# Patient Record
Sex: Female | Born: 1990 | Race: Black or African American | Hispanic: No | Marital: Married | Smoking: Never smoker
Health system: Southern US, Community
[De-identification: ages and names within clinical notes are randomized; demographics above are authoritative.]

## PROBLEM LIST (undated history)

## (undated) ENCOUNTER — Inpatient Hospital Stay (HOSPITAL_COMMUNITY): Payer: Self-pay

## (undated) DIAGNOSIS — F32A Depression, unspecified: Secondary | ICD-10-CM

## (undated) DIAGNOSIS — R635 Abnormal weight gain: Secondary | ICD-10-CM

## (undated) DIAGNOSIS — F419 Anxiety disorder, unspecified: Secondary | ICD-10-CM

## (undated) DIAGNOSIS — G43909 Migraine, unspecified, not intractable, without status migrainosus: Secondary | ICD-10-CM

## (undated) DIAGNOSIS — N926 Irregular menstruation, unspecified: Principal | ICD-10-CM

## (undated) HISTORY — DX: Migraine, unspecified, not intractable, without status migrainosus: G43.909

## (undated) HISTORY — PX: WISDOM TOOTH EXTRACTION: SHX21

## (undated) HISTORY — DX: Anxiety disorder, unspecified: F41.9

## (undated) HISTORY — DX: Depression, unspecified: F32.A

## (undated) HISTORY — DX: Irregular menstruation, unspecified: N92.6

## (undated) HISTORY — DX: Abnormal weight gain: R63.5

---
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
## 2009-01-27 ENCOUNTER — Encounter: Payer: Self-pay | Admitting: Orthopedic Surgery

## 2009-05-24 ENCOUNTER — Emergency Department (HOSPITAL_COMMUNITY): Admission: EM | Admit: 2009-05-24 | Discharge: 2009-05-24 | Payer: Self-pay | Admitting: Emergency Medicine

## 2010-01-20 ENCOUNTER — Emergency Department (HOSPITAL_COMMUNITY): Admission: EM | Admit: 2010-01-20 | Discharge: 2010-01-20 | Payer: Self-pay | Admitting: Emergency Medicine

## 2010-05-19 ENCOUNTER — Ambulatory Visit (HOSPITAL_COMMUNITY): Admission: AD | Admit: 2010-05-19 | Discharge: 2010-05-19 | Payer: Self-pay | Admitting: Obstetrics & Gynecology

## 2010-08-03 ENCOUNTER — Inpatient Hospital Stay (HOSPITAL_COMMUNITY): Admission: RE | Admit: 2010-08-03 | Discharge: 2010-08-05 | Payer: Self-pay | Admitting: Obstetrics

## 2010-12-28 LAB — CBC
MCH: 32 pg (ref 26.0–34.0)
MCV: 94.9 fL (ref 78.0–100.0)
Platelets: 177 10*3/uL (ref 150–400)
Platelets: 201 10*3/uL (ref 150–400)
RBC: 2.97 MIL/uL — ABNORMAL LOW (ref 3.87–5.11)
RDW: 14.4 % (ref 11.5–15.5)
WBC: 11 10*3/uL — ABNORMAL HIGH (ref 4.0–10.5)
WBC: 14.7 10*3/uL — ABNORMAL HIGH (ref 4.0–10.5)

## 2010-12-28 LAB — RPR: RPR Ser Ql: NONREACTIVE

## 2010-12-28 LAB — RH IMMUNE GLOB WKUP(>/=20WKS)(NOT WOMEN'S HOSP): Fetal Screen: NEGATIVE

## 2010-12-30 LAB — RH IMMUNE GLOBULIN WORKUP (NOT WOMEN'S HOSP)
ABO/RH(D): B NEG
Antibody Screen: NEGATIVE

## 2012-04-08 ENCOUNTER — Other Ambulatory Visit: Payer: Self-pay | Admitting: Family Medicine

## 2012-04-08 DIAGNOSIS — G43909 Migraine, unspecified, not intractable, without status migrainosus: Secondary | ICD-10-CM

## 2012-04-12 ENCOUNTER — Ambulatory Visit (HOSPITAL_COMMUNITY)
Admission: RE | Admit: 2012-04-12 | Discharge: 2012-04-12 | Disposition: A | Discharge status: Home / Self Care | Payer: PRIVATE HEALTH INSURANCE | Source: Ambulatory Visit | Attending: Family Medicine | Admitting: Family Medicine

## 2012-04-12 DIAGNOSIS — G43909 Migraine, unspecified, not intractable, without status migrainosus: Secondary | ICD-10-CM | POA: Insufficient documentation

## 2013-03-05 ENCOUNTER — Emergency Department (HOSPITAL_COMMUNITY)
Admission: EM | Admit: 2013-03-05 | Discharge: 2013-03-05 | Disposition: A | Discharge status: Home / Self Care | Payer: PRIVATE HEALTH INSURANCE | Attending: Emergency Medicine | Admitting: Emergency Medicine

## 2013-03-05 ENCOUNTER — Encounter (HOSPITAL_COMMUNITY): Payer: Self-pay

## 2013-03-05 DIAGNOSIS — K089 Disorder of teeth and supporting structures, unspecified: Secondary | ICD-10-CM | POA: Insufficient documentation

## 2013-03-05 DIAGNOSIS — K029 Dental caries, unspecified: Secondary | ICD-10-CM | POA: Insufficient documentation

## 2013-03-05 DIAGNOSIS — K0889 Other specified disorders of teeth and supporting structures: Secondary | ICD-10-CM

## 2013-03-05 MED ORDER — HYDROCODONE-ACETAMINOPHEN 5-325 MG PO TABS: ORAL_TABLET | ORAL | Status: DC

## 2013-03-05 MED ORDER — AMOXICILLIN 500 MG PO CAPS: 500.0000 mg | ORAL_CAPSULE | Freq: Three times a day (TID) | ORAL | Status: DC

## 2013-03-05 NOTE — ED Notes (Signed)
Complain of dental pain from a broken tooth

## 2013-03-07 NOTE — ED Provider Notes (Signed)
History     CSN: [id number]  Arrival date & time 03/05/13  1659   First MD Initiated Contact with Patient 03/05/13 1708      Chief Complaint  Patient presents with  . Dental Pain    (Consider location/radiation/quality/duration/timing/severity/associated sxs/prior treatment) Patient is a 22 y.o. female presenting with tooth pain. The history is provided by the patient.  Dental Pain Location:  Upper Upper teeth location:  2/RU 2nd molar Quality:  Aching and throbbing Severity:  Moderate Onset quality:  Gradual Timing:  Constant Progression:  Unchanged Chronicity:  New Context: dental caries and poor dentition   Context: not abscess, cap still on, not dental fracture, not recent dental surgery and not trauma   Relieved by:  Nothing Worsened by:  Hot food/drink and cold food/drink Ineffective treatments:  Acetaminophen Associated symptoms: no congestion, no difficulty swallowing, no drooling, no facial pain, no facial swelling, no fever, no gum swelling, no headaches, no neck pain, no neck swelling, no oral bleeding and no trismus   Risk factors: periodontal disease     History reviewed. No pertinent past medical history.  History reviewed. No pertinent past surgical history.  No family history on file.  History  Substance Use Topics  . Smoking status: Not on file  . Smokeless tobacco: Not on file  . Alcohol Use: No    OB History   Grav Para Term Preterm Abortions TAB SAB Ect Mult Living                  Review of Systems  Constitutional: Negative for fever and appetite change.  HENT: Positive for dental problem. Negative for congestion, sore throat, facial swelling, drooling, trouble swallowing, neck pain and neck stiffness.   Eyes: Negative for pain and visual disturbance.  Neurological: Negative for dizziness, facial asymmetry and headaches.  Hematological: Negative for adenopathy.  All other systems reviewed and are negative.    Allergies  Review of  patient's allergies indicates no known allergies.  Home Medications   Current Outpatient Rx  Name  Route  Sig  Dispense  Refill  . amoxicillin (AMOXIL) 500 MG capsule   Oral   Take 1 capsule (500 mg total) by mouth 3 (three) times daily.   30 capsule   0   . HYDROcodone-acetaminophen (NORCO/VICODIN) 5-325 MG per tablet      Take one-two tabs po q 4-6 hrs prn pain   20 tablet   0     BP 125/80  Pulse 95  Temp(Src) 97.5 F (36.4 C) (Oral)  Resp 18  Ht 5' 3.25" (1.607 m)  Wt 186 lb (84.369 kg)  BMI 32.67 kg/m2  SpO2 98%  LMP 02/12/2013  Physical Exam  Nursing note and vitals reviewed. Constitutional: She is oriented to person, place, and time. She appears well-developed and well-nourished. No distress.  HENT:  Head: Normocephalic and atraumatic. No trismus in the jaw.  Right Ear: Tympanic membrane and ear canal normal.  Left Ear: Tympanic membrane and ear canal normal.  Mouth/Throat: Uvula is midline, oropharynx is clear and moist and mucous membranes are normal. Dental caries present. No dental abscesses or edematous.  ttp of the right upper second molar.  Partially avulsed tooth with dental decay present.  No dental abscess, trismus or facial swelling.   Neck: Normal range of motion. Neck supple.  Cardiovascular: Normal rate, regular rhythm, normal heart sounds and intact distal pulses.   No murmur heard. Pulmonary/Chest: Effort normal and breath sounds normal. No respiratory distress.  Musculoskeletal: Normal range of motion.  Lymphadenopathy:    She has no cervical adenopathy.  Neurological: She is alert and oriented to person, place, and time. She exhibits normal muscle tone. Coordination normal.  Skin: Skin is warm and dry.    ED Course  Procedures (including critical care time)  Labs Reviewed - No data to display No results found.   1. Pain, dental       MDM   VSS.  Patient is non-toxic appearing, no trismus or airway compromise.    The patient  appears reasonably screened and/or stabilized for discharge and I doubt any other medical condition or other [facility] requiring further screening, evaluation, or treatment in the ED at this time prior to discharge.      Anastazia Creek L. Trisha Mangle, PA-C 03/07/13 2013

## 2013-03-09 NOTE — ED Provider Notes (Signed)
Medical screening examination/treatment/procedure(s) were performed by non-physician practitioner and as supervising physician I was immediately available for consultation/collaboration.  Donnetta Hutching, MD 03/09/13 [phone]

## 2013-12-03 ENCOUNTER — Encounter (HOSPITAL_COMMUNITY): Payer: Self-pay | Admitting: Emergency Medicine

## 2013-12-03 ENCOUNTER — Emergency Department (HOSPITAL_COMMUNITY)
Admission: EM | Admit: 2013-12-03 | Discharge: 2013-12-03 | Disposition: A | Discharge status: Home / Self Care | Payer: 59 | Attending: Emergency Medicine | Admitting: Emergency Medicine

## 2013-12-03 DIAGNOSIS — R42 Dizziness and giddiness: Secondary | ICD-10-CM

## 2013-12-03 DIAGNOSIS — Z3202 Encounter for pregnancy test, result negative: Secondary | ICD-10-CM | POA: Insufficient documentation

## 2013-12-03 DIAGNOSIS — G43909 Migraine, unspecified, not intractable, without status migrainosus: Secondary | ICD-10-CM | POA: Insufficient documentation

## 2013-12-03 DIAGNOSIS — H6120 Impacted cerumen, unspecified ear: Secondary | ICD-10-CM | POA: Insufficient documentation

## 2013-12-03 DIAGNOSIS — H5710 Ocular pain, unspecified eye: Secondary | ICD-10-CM | POA: Insufficient documentation

## 2013-12-03 LAB — POCT PREGNANCY, URINE: PREG TEST UR: NEGATIVE

## 2013-12-03 MED ORDER — MECLIZINE HCL 12.5 MG PO TABS
25.0000 mg | ORAL_TABLET | Freq: Once | ORAL | Status: AC
  Administered 2013-12-03: 25 mg via ORAL
  Filled 2013-12-03: qty 2

## 2013-12-03 MED ORDER — MECLIZINE HCL 25 MG PO TABS: 25.0000 mg | ORAL_TABLET | Freq: Three times a day (TID) | ORAL | Status: DC | PRN

## 2013-12-03 MED ORDER — CARBAMIDE PEROXIDE 6.5 % OT SOLN
5.0000 [drp] | Freq: Once | OTIC | Status: AC
  Administered 2013-12-03: 5 [drp] via OTIC
  Filled 2013-12-03: qty 15

## 2013-12-03 MED ORDER — KETOROLAC TROMETHAMINE 60 MG/2ML IM SOLN
60.0000 mg | Freq: Once | INTRAMUSCULAR | Status: AC
  Administered 2013-12-03: 60 mg via INTRAMUSCULAR
  Filled 2013-12-03: qty 2

## 2013-12-03 NOTE — ED Notes (Signed)
Irrigated right ear with warm water. Patient complained with some tenderness during irrigation. Small particles of ear wax removed with irrigation.

## 2013-12-03 NOTE — ED Notes (Signed)
Pt c/o intermittent sharp pain in left eye since last night and dizziness.  Attempted to perform visual acuity but pt said couldn't tolerate opening her eyes long enough to see the eye chart.   Denies any problems seeing.

## 2013-12-03 NOTE — Discharge Instructions (Signed)
Cerumen Impaction A cerumen impaction is when the wax in your ear forms a plug. This plug usually causes reduced hearing. Sometimes it also causes an earache or dizziness. Removing a cerumen impaction can be difficult and painful. The wax sticks to the ear canal. The canal is sensitive and bleeds easily. If you try to remove a heavy wax buildup with a cotton tipped swab, you may push it in further. Irrigation with water, suction, and small ear curettes may be used to clear out the wax. If the impaction is fixed to the skin in the ear canal, ear drops may be needed for a few days to loosen the wax. People who build up a lot of wax frequently can use ear wax removal products available in your local drugstore. SEEK MEDICAL CARE IF:  You develop an earache, increased hearing loss, or marked dizziness. Document Released: 11/09/2004 Document Revised: 12/25/2011 Document Reviewed: 12/30/2009 [facility] Patient Information 2014 [place], [place].  Vertigo Vertigo means you feel like you or your surroundings are moving when they are not. Vertigo can be dangerous if it occurs when you are at work, driving, or performing difficult activities.  CAUSES  Vertigo occurs when there is a conflict of signals sent to your brain from the visual and sensory systems in your body. There are many different causes of vertigo, including:  Infections, especially in the inner ear.  A bad reaction to a drug or misuse of alcohol and medicines.  Withdrawal from drugs or alcohol.  Rapidly changing positions, such as lying down or rolling over in bed.  A migraine headache.  Decreased blood flow to the brain.  Increased pressure in the brain from a head injury, infection, tumor, or bleeding. SYMPTOMS  You may feel as though the world is spinning around or you are falling to the ground. Because your balance is upset, vertigo can cause nausea and vomiting. You may have involuntary eye movements (nystagmus). DIAGNOSIS  Vertigo  is usually diagnosed by physical exam. If the cause of your vertigo is unknown, your caregiver may perform imaging tests, such as an MRI scan (magnetic resonance imaging). TREATMENT  Most cases of vertigo resolve on their own, without treatment. Depending on the cause, your caregiver may prescribe certain medicines. If your vertigo is related to body position issues, your caregiver may recommend movements or procedures to correct the problem. In rare cases, if your vertigo is caused by certain inner ear problems, you may need surgery. HOME CARE INSTRUCTIONS   Follow your caregiver's instructions.  Avoid driving.  Avoid operating heavy machinery.  Avoid performing any tasks that would be dangerous to you or others during a vertigo episode.  Tell your caregiver if you notice that certain medicines seem to be causing your vertigo. Some of the medicines used to treat vertigo episodes can actually make them worse in some people. SEEK IMMEDIATE MEDICAL CARE IF:   Your medicines do not relieve your vertigo or are making it worse.  You develop problems with talking, walking, weakness, or using your arms, hands, or legs.  You develop severe headaches.  Your nausea or vomiting continues or gets worse.  You develop visual changes.  A family member notices behavioral changes.  Your condition gets worse. MAKE SURE YOU:  Understand these instructions.  Will watch your condition.  Will get help right away if you are not doing well or get worse. Document Released: 07/12/2005 Document Revised: 12/25/2011 Document Reviewed: 04/20/2011 [facility] Patient Information 2014 [place], [place]. Migraine Headache A migraine  headache is an intense, throbbing pain on one or both sides of your head. A migraine can last for 30 minutes to several hours. CAUSES  The exact cause of a migraine headache is not always known. However, a migraine may be caused when nerves in the brain become irritated and release  chemicals that cause inflammation. This causes pain. Certain things may also trigger migraines, such as:  Alcohol.  Smoking.  Stress.  Menstruation.  Aged cheeses.  Foods or drinks that contain nitrates, glutamate, aspartame, or tyramine.  Lack of sleep.  Chocolate.  Caffeine.  Hunger.  Physical exertion.  Fatigue.  Medicines used to treat chest pain (nitroglycerine), birth control pills, estrogen, and some blood pressure medicines. SIGNS AND SYMPTOMS  Pain on one or both sides of your head.  Pulsating or throbbing pain.  Severe pain that prevents daily activities.  Pain that is aggravated by any physical activity.  Nausea, vomiting, or both.  Dizziness.  Pain with exposure to bright lights, loud noises, or activity.  General sensitivity to bright lights, loud noises, or smells. Before you get a migraine, you may get warning signs that a migraine is coming (aura). An aura may include:  Seeing flashing lights.  Seeing bright spots, halos, or zig-zag lines.  Having tunnel vision or blurred vision.  Having feelings of numbness or tingling.  Having trouble talking.  Having muscle weakness. DIAGNOSIS  A migraine headache is often diagnosed based on:  Symptoms.  Physical exam.  A CT scan or MRI of your head. These imaging tests cannot diagnose migraines, but they can help rule out other causes of headaches. TREATMENT Medicines may be given for pain and nausea. Medicines can also be given to help prevent recurrent migraines.  HOME CARE INSTRUCTIONS  Only take over-the-counter or prescription medicines for pain or discomfort as directed by your health care provider. The use of long-term narcotics is not recommended.  Lie down in a dark, quiet room when you have a migraine.  Keep a journal to find out what may trigger your migraine headaches. For example, write down:  What you eat and drink.  How much sleep you get.  Any change to your diet or  medicines.  Limit alcohol consumption.  Quit smoking if you smoke.  Get 7 9 hours of sleep, or as recommended by your health care provider.  Limit stress.  Keep lights dim if bright lights bother you and make your migraines worse. SEEK IMMEDIATE MEDICAL CARE IF:   Your migraine becomes severe.  You have a fever.  You have a stiff neck.  You have vision loss.  You have muscular weakness or loss of muscle control.  You start losing your balance or have trouble walking.  You feel faint or pass out.  You have severe symptoms that are different from your first symptoms. MAKE SURE YOU:   Understand these instructions.  Will watch your condition.  Will get help right away if you are not doing well or get worse. Document Released: 10/02/2005 Document Revised: 07/23/2013 Document Reviewed: 06/09/2013 [facility] Patient Information 2014 [place], [place].

## 2013-12-03 NOTE — ED Notes (Signed)
Pt resting with coat lying across her face. NAD.

## 2013-12-03 NOTE — ED Provider Notes (Signed)
CSN: [id number]     Arrival date & time 12/03/13  1609 History   First MD Initiated Contact with Patient 12/03/13 1747     Chief Complaint  Patient presents with  . Eye Pain  . Dizziness     (Consider location/radiation/quality/duration/timing/severity/associated sxs/prior Treatment) Patient is a 23 y.o. female presenting with eye pain and dizziness.  Eye Pain  Dizziness  Pt reports onset intermittent of sharp pain in L eye earlier today, associated with photophobia and headache initially, although headache improved with a 'migraine medicine'. She reports intermittent episode of room spinning dizziness during the day today as well with some nausea but no vomiting. She has prior history of migraines but none for several months.   History reviewed. No pertinent past medical history. History reviewed. No pertinent past surgical history. No family history on file. History  Substance Use Topics  . Smoking status: Never Smoker   . Smokeless tobacco: Not on file  . Alcohol Use: No   OB History   Grav Para Term Preterm Abortions TAB SAB Ect Mult Living                 Review of Systems  Eyes: Positive for pain.  Neurological: Positive for dizziness.   All other systems reviewed and are negative except as noted in HPI.     Allergies  Review of patient's allergies indicates no known allergies.  Home Medications   Current Outpatient Rx  Name  Route  Sig  Dispense  Refill  . aspirin-acetaminophen-caffeine (EXCEDRIN MIGRAINE) 250-250-65 MG per tablet   Oral   Take 2 tablets by mouth every 6 (six) hours as needed for headache.          BP 116/83  Pulse 91  Temp(Src) 98.5 F (36.9 C) (Oral)  Resp 18  Ht 5\' 4"  (1.626 m)  Wt 209 lb 3 oz (94.887 kg)  BMI 35.89 kg/m2  SpO2 98%  LMP 11/02/2013 Physical Exam  Nursing note and vitals reviewed. Constitutional: She is oriented to person, place, and time. She appears well-developed and well-nourished.  HENT:  Head:  Normocephalic and atraumatic.  Cerumen impaction on the R, clear on the L  Eyes: EOM are normal. Pupils are equal, round, and reactive to light. Right eye exhibits no discharge. Left eye exhibits no discharge.  Globes intact, anterior chambers clear, no conjunctival injection, photophobic in both eyes  Neck: Normal range of motion. Neck supple.  Cardiovascular: Normal rate, normal heart sounds and intact distal pulses.   Pulmonary/Chest: Effort normal and breath sounds normal.  Abdominal: Bowel sounds are normal. She exhibits no distension. There is no tenderness.  Musculoskeletal: Normal range of motion. She exhibits no edema and no tenderness.  Neurological: She is alert and oriented to person, place, and time. She has normal strength. No cranial nerve deficit or sensory deficit.  Skin: Skin is warm and dry. No rash noted.  Psychiatric: She has a normal mood and affect.    ED Course  Procedures (including critical care time) Labs Review Labs Reviewed  POCT PREGNANCY, URINE   Imaging Review No results found.  EKG Interpretation    Date/Time:  Wednesday December 03 2013 16:23:27 EST Ventricular Rate:  89 PR Interval:  112 QRS Duration: 80 QT Interval:  354 QTC Calculation: 430 R Axis:   72 Text Interpretation:  Normal sinus rhythm with sinus arrhythmia Normal ECG When compared with ECG of 20-Jan-2010 20:38, No significant change was found Confirmed by [facility]  MD, CHARLES [phone])  on 12/03/2013 4:36:30 PM            MDM   Final diagnoses:  Cerumen impaction  Vertigo  Eye pain    Grossly normal eye exam, cannot tolerate fundoscopic exam due to photophobia, could be sequelae of migraine. Will give toradol, meclizine for dizziness/vertigo and ask nurse to clear out R ear canal.   7:33 PM Pt states sharp eye pain and dizziness has resolved, but now 'just doesn't feel good'. She is unable to further quantify. No change in exam, R TM now normal after cerumen removal. Will  plan d/c with Meclizine and close PCP followup if not improving.   Charles B. Bernette MayersSheldon, MD 12/03/13 2105

## 2013-12-03 NOTE — ED Notes (Signed)
Spoke with Loney LaurenceH. Bryant PA about pt, ekg and CT head ordered.

## 2013-12-04 ENCOUNTER — Encounter: Payer: Self-pay | Admitting: Family Medicine

## 2013-12-04 ENCOUNTER — Ambulatory Visit (INDEPENDENT_AMBULATORY_CARE_PROVIDER_SITE_OTHER): Payer: 59 | Admitting: Family Medicine

## 2013-12-04 VITALS — BP 110/78 | Ht 64.0 in | Wt 210.0 lb

## 2013-12-04 DIAGNOSIS — J329 Chronic sinusitis, unspecified: Secondary | ICD-10-CM

## 2013-12-04 MED ORDER — ONDANSETRON 4 MG PO TBDP: 4.0000 mg | ORAL_TABLET | Freq: Four times a day (QID) | ORAL | Status: DC | PRN

## 2013-12-04 MED ORDER — CEFPROZIL 500 MG PO TABS: 500.0000 mg | ORAL_TABLET | Freq: Two times a day (BID) | ORAL | Status: DC

## 2013-12-04 NOTE — Progress Notes (Signed)
   Subjective:    Patient ID: Melissa Lin, female    DOB: 08/25/1991, 23 y.o.   MRN: [id number]  [facility] Flushed right ear. Now having ear pain. Prescribed meclizine 25 mg. Took med last night but not today. Having nausea.   Got really dizzy, awoke with it  Worse with certain motions  No prior history of vertigo. No major headache. Notes some ear pressure and discomfort. No cough.  No loss of consciousness some nausea when dizziness flares up.  Review of Systems No vomiting no chest pain no back pain no abdominal pain ROS otherwise negative    Objective:   Physical Exam Alert moderate malaise. Left TM retracted left frontal region possible tenderness pharynx normal neck supple lungs clear heart regular in rhythm.       Assessment & Plan:  Impression vertigo with possible sinusitis element discussed plan Cefzil twice a day 10 days. Antivert when necessary. Zofran when necessary. Recheck her persists. WSL

## 2013-12-24 ENCOUNTER — Encounter: Payer: Self-pay | Admitting: Adult Health

## 2013-12-24 ENCOUNTER — Ambulatory Visit (INDEPENDENT_AMBULATORY_CARE_PROVIDER_SITE_OTHER): Payer: 59 | Admitting: Adult Health

## 2013-12-24 VITALS — BP 128/80 | Ht 64.0 in | Wt 217.0 lb

## 2013-12-24 DIAGNOSIS — Z3202 Encounter for pregnancy test, result negative: Secondary | ICD-10-CM

## 2013-12-24 LAB — POCT URINE PREGNANCY: Preg Test, Ur: NEGATIVE

## 2013-12-24 NOTE — Progress Notes (Signed)
Patient ID: Melissa Lin, female   DOB: 23-Dec-1990, 23 y.o.   MRN: [id number] Pt here for pregnancy test, resulted negative. QHCG done today, LMP 11/02/2013.

## 2013-12-25 ENCOUNTER — Telehealth: Payer: Self-pay | Admitting: Adult Health

## 2013-12-25 LAB — HCG, QUANTITATIVE, PREGNANCY

## 2013-12-25 NOTE — Telephone Encounter (Signed)
Pt states that she did not have a period in feb. And wants to wait to see if her period will start. I offered to make the pt an appointment to discuss issue with a provider but she refused.

## 2014-12-09 ENCOUNTER — Encounter: Payer: Self-pay | Admitting: Family Medicine

## 2014-12-09 ENCOUNTER — Ambulatory Visit (INDEPENDENT_AMBULATORY_CARE_PROVIDER_SITE_OTHER): Payer: 59 | Admitting: Family Medicine

## 2014-12-09 VITALS — BP 114/70 | Temp 98.7°F | Ht 64.0 in | Wt 224.0 lb

## 2014-12-09 DIAGNOSIS — A084 Viral intestinal infection, unspecified: Secondary | ICD-10-CM

## 2014-12-09 MED ORDER — ONDANSETRON HCL 8 MG PO TABS: 8.0000 mg | ORAL_TABLET | Freq: Three times a day (TID) | ORAL | Status: DC | PRN

## 2014-12-09 NOTE — Progress Notes (Signed)
   Subjective:    Patient ID: Melissa Lin, female    DOB: 1991/02/03, 24 y.o.   MRN: [id number]  HPIBody aches, headache, and fatigue. Started Monday. Vomited about 7 times on Monday. None since. Taking motrin and tylenol.  Patient relates intermittent body aches feeling rundown not feeling good multiple times vomiting now actually getting over that part no diarrhea no dysuria no cough wheezing. No sore throat   Review of Systems  Constitutional: Negative for fever and activity change.  HENT: Negative for congestion, ear pain and rhinorrhea.   Eyes: Negative for discharge.  Respiratory: Negative for cough, shortness of breath and wheezing.   Cardiovascular: Negative for chest pain.  Gastrointestinal: Positive for nausea, vomiting and diarrhea.       Objective:   Physical Exam  Constitutional: She appears well-developed.  HENT:  Head: Normocephalic.  Nose: Nose normal.  Mouth/Throat: Oropharynx is clear and moist. No oropharyngeal exudate.  Neck: Neck supple.  Cardiovascular: Normal rate and normal heart sounds.   No murmur heard. Pulmonary/Chest: Effort normal and breath sounds normal. She has no wheezes.  Abdominal: Soft. There is no tenderness.  Lymphadenopathy:    She has no cervical adenopathy.  Skin: Skin is warm and dry.  Nursing note and vitals reviewed.         Assessment & Plan:  Viral syndrome Gastroenteritis Should gradually get better Supportive measures discuss Antibiotics not necessary.

## 2015-02-18 ENCOUNTER — Ambulatory Visit (INDEPENDENT_AMBULATORY_CARE_PROVIDER_SITE_OTHER): Payer: 59 | Admitting: Family Medicine

## 2015-02-18 ENCOUNTER — Encounter: Payer: Self-pay | Admitting: Family Medicine

## 2015-02-18 VITALS — BP 130/88 | Temp 98.2°F | Ht 64.0 in | Wt 230.0 lb

## 2015-02-18 DIAGNOSIS — G43009 Migraine without aura, not intractable, without status migrainosus: Secondary | ICD-10-CM | POA: Diagnosis not present

## 2015-02-18 MED ORDER — HYDROCODONE-ACETAMINOPHEN 5-325 MG PO TABS: ORAL_TABLET | ORAL | Status: DC

## 2015-02-18 NOTE — Progress Notes (Signed)
   Subjective:    Patient ID: Melissa Lin, female    DOB: 1991/06/16, 24 y.o.   MRN: [id number]  Migraine  This is a recurrent problem. Associated symptoms include dizziness and photophobia. She has tried acetaminophen (advil) for the symptoms.   Tuesday  Right sided throbng   Hasn't had one for a month or two   Light bothers eye  Sound bothers pt  Started before work On average gets migraines every 1-2 months. Has taken Imitrex or similar in the past and it caused difficulties and side effects. This headache this week not responsive to ibuprofen or Tylenol.   bp up in the past Review of Systems  Eyes: Positive for photophobia.  Neurological: Positive for dizziness.   No vomiting no diarrhea no chest pain    Objective:   Physical Exam  Alert no acute distress. Lungs clear. Heart regular in rhythm. H&T normal. Neck supple neuro intact Cerebellar function intact. Neuro exam intact.     Assessment & Plan:  Impression 1 migraine headache resistant to normal interventions at this time. Plan hydrocodone No. 15 no refills use sparingly work excuse given. Encouraged to use ibuprofen preferentially in the future. If continues to recur frequent may need a primary prophylaxis intervention such as Topamax discussed 25 minutes spent most in discussion

## 2015-04-22 ENCOUNTER — Ambulatory Visit (INDEPENDENT_AMBULATORY_CARE_PROVIDER_SITE_OTHER): Payer: 59 | Admitting: Nurse Practitioner

## 2015-04-22 ENCOUNTER — Telehealth: Payer: Self-pay | Admitting: Nurse Practitioner

## 2015-04-22 ENCOUNTER — Encounter: Payer: Self-pay | Admitting: Family Medicine

## 2015-04-22 ENCOUNTER — Encounter: Payer: Self-pay | Admitting: Nurse Practitioner

## 2015-04-22 VITALS — BP 110/80 | Temp 98.1°F | Ht 64.0 in | Wt 238.0 lb

## 2015-04-22 DIAGNOSIS — G43011 Migraine without aura, intractable, with status migrainosus: Secondary | ICD-10-CM

## 2015-04-22 MED ORDER — NAPROXEN 500 MG PO TABS: 500.0000 mg | ORAL_TABLET | Freq: Two times a day (BID) | ORAL | Status: DC

## 2015-04-22 MED ORDER — PROMETHAZINE HCL 25 MG PO TABS: 25.0000 mg | ORAL_TABLET | Freq: Four times a day (QID) | ORAL | Status: DC | PRN

## 2015-04-22 MED ORDER — PROPRANOLOL HCL ER 80 MG PO CP24: 80.0000 mg | ORAL_CAPSULE | Freq: Every day | ORAL | Status: DC

## 2015-04-22 MED ORDER — ISOMETHEPTENE-DICHLORAL-APAP 65-100-325 MG PO CAPS: ORAL_CAPSULE | ORAL | Status: DC

## 2015-04-22 NOTE — Telephone Encounter (Signed)
Her last CT scan done in 2013 was normal. We can consider repeating it but please talk to patient first. This request came from her mother.

## 2015-04-22 NOTE — Telephone Encounter (Signed)
Pt was seen today, her mom called her upon leaving an requested that she  Ask you for some for of scan or MRI to have her head looked at further since  These headaches seem to be getting worse.    Please advise

## 2015-04-23 ENCOUNTER — Encounter: Payer: Self-pay | Admitting: Nurse Practitioner

## 2015-04-23 NOTE — Progress Notes (Addendum)
Subjective:  Presents for c/o migraine headache x 2 d. No change in symptomatology but very intense headache. Nausea with dry heaves. Has had some dizziness which is not usual for her. Averaging 1-2 migraines per month. Runny nose. No fever or sore throat cough or ear pain. No numbness or weakness of the face arms or legs. Photosensitivity and phonophobia. No visual changes. Worked her job yesterday with great difficulty. Minimal relief with OTC ibuprofen.  Objective:   BP 110/80 mmHg  Temp(Src) 98.1 F (36.7 C) (Oral)  Ht 5\' 4"  (1.626 m)  Wt 238 lb (107.956 kg)  BMI 40.83 kg/m2 NAD. Alert, oriented. TMs minimal clear effusion, no erythema. Pharynx clear. Neck supple without adenopathy. Lungs clear. Heart regular rate rhythm. Tight tender muscles noted along the upper back and neck area. Unable to tolerate funduscopic exam due to photosensitivity. Normal CT scan of the head 04/12/2012.  Assessment:  Problem List Items Addressed This Visit      Cardiovascular and Mediastinum   Migraine headache without aura - Primary   Relevant Medications   propranolol ER (INDERAL LA) 80 MG 24 hr capsule   isometheptene-acetaminophen-dichloralphenazone (MIDRIN) 65-100-325 MG capsule   naproxen (NAPROSYN) 500 MG tablet     Plan:  Meds ordered this encounter  Medications  . propranolol ER (INDERAL LA) 80 MG 24 hr capsule    Sig: Take 1 capsule (80 mg total) by mouth daily. For migraine prevention    Dispense:  30 capsule    Refill:  2    Order Specific Question:  Supervising Provider    Answer:  Merlyn Albert [[id number]]  . isometheptene-acetaminophen-dichloralphenazone (MIDRIN) 65-100-325 MG capsule    Sig: 2 po onset of migraine then may repeat one every hour until relief; max 5 per 12 hours    Dispense:  30 capsule    Refill:  0    Order Specific Question:  Supervising Provider    Answer:  Merlyn Albert [[id number]]  . promethazine (PHENERGAN) 25 MG tablet    Sig: Take 1 tablet (25 mg total) by  mouth every 6 (six) hours as needed for nausea or vomiting.    Dispense:  20 tablet    Refill:  0    Order Specific Question:  Supervising Provider    Answer:  Merlyn Albert [[id number]]  . naproxen (NAPROSYN) 500 MG tablet    Sig: Take 1 tablet (500 mg total) by mouth 2 (two) times daily with a meal. Prn migraine    Dispense:  30 tablet    Refill:  0    Order Specific Question:  Supervising Provider    Answer:  Merlyn Albert [[id number]]   Phenergan and naproxen for current headache. Given Midrin as directed for abortive therapy. Is not planning pregnancy but also not using birth control. Will not use topiramate unless patient is on reliable birth control. Start Inderal as directed. Warning signs reviewed. Call back in a.m. if no improvement in headache, go to ED if worsens. Return in about 1 month (around 05/23/2015) for recheck migraines.

## 2015-04-23 NOTE — Telephone Encounter (Signed)
Patient states they want to make sure nothing has changed and she wants to know why she always has headache.

## 2015-04-23 NOTE — Telephone Encounter (Signed)
She had a headache yesterday and may not remember our entire conversation. We try our best to identify any triggers such as stress, weather, foods, hormones, etc. One potential trigger identified yesterday is tight muscle spasms of the neck and upper back (usually related to stress). That is why we started her on the Inderal as a preventive medicine due to frequent headaches. This is not unusual to have a flare up at times. So let me make sure: she does want me to proceed with scan?

## 2015-04-23 NOTE — Telephone Encounter (Signed)
Patient stated she will hold on scan and try new preventive med and keep a headache diary and see if that helps with headaches

## 2015-05-05 ENCOUNTER — Encounter: Payer: Self-pay | Admitting: Adult Health

## 2015-05-05 ENCOUNTER — Other Ambulatory Visit: Payer: 59 | Admitting: Adult Health

## 2015-05-24 ENCOUNTER — Telehealth: Payer: Self-pay | Admitting: Nurse Practitioner

## 2015-05-24 NOTE — Telephone Encounter (Signed)
Patient was seen last month by Eber Jones for her migraines.  She says that Eber Jones was to get back with her regarding her FMLA.  She says she did not drop any papers off, but she said Eber Jones was to call her back and let her know if she was going to be able to do the [facility] because she does not know how to access the information.  Please advise.

## 2015-05-24 NOTE — Telephone Encounter (Signed)
LMOM for patient to bring in forms.

## 2015-05-24 NOTE — Telephone Encounter (Signed)
Can we get these forms? All of my patients bring them in. If we can get them, I will fill them out. Thanks. She is an Hotel manager

## 2015-06-24 ENCOUNTER — Encounter: Payer: Self-pay | Admitting: Adult Health

## 2015-06-24 ENCOUNTER — Ambulatory Visit (INDEPENDENT_AMBULATORY_CARE_PROVIDER_SITE_OTHER): Payer: 59 | Admitting: Adult Health

## 2015-06-24 VITALS — BP 120/80 | HR 104 | Ht 64.0 in | Wt 234.0 lb

## 2015-06-24 DIAGNOSIS — Z3202 Encounter for pregnancy test, result negative: Secondary | ICD-10-CM

## 2015-06-24 DIAGNOSIS — R635 Abnormal weight gain: Secondary | ICD-10-CM | POA: Insufficient documentation

## 2015-06-24 DIAGNOSIS — N926 Irregular menstruation, unspecified: Secondary | ICD-10-CM | POA: Insufficient documentation

## 2015-06-24 HISTORY — DX: Irregular menstruation, unspecified: N92.6

## 2015-06-24 HISTORY — DX: Abnormal weight gain: R63.5

## 2015-06-24 LAB — POCT URINE PREGNANCY: PREG TEST UR: NEGATIVE

## 2015-06-24 MED ORDER — MEDROXYPROGESTERONE ACETATE 10 MG PO TABS: 10.0000 mg | ORAL_TABLET | Freq: Every day | ORAL | Status: DC

## 2015-06-24 NOTE — Progress Notes (Signed)
Subjective:     Patient ID: [name], female   DOB: 11-12-1990, 24 y.o.   MRN: [id number]  HPI Tashea is a 24 year old black female, in complaining of not having a period since May, and weight gain.Has history of periods being irregular but has not skipped for 4 months.She is G1P1 and works on [employer] at [employer].  Review of Systems Patient denies any daily headaches(has migraines), hearing loss, fatigue, blurred vision, shortness of breath, chest pain, abdominal pain, problems with bowel movements, urination, or intercourse. No joint pain or mood swings.+missed periods and weight gain,has history of irregular periods, but not like this Reviewed past medical,surgical, social and family history. Reviewed medications and allergies.     Objective:   Physical Exam BP 120/80 mmHg  Pulse 104  Ht 5\' 4"  (1.626 m)  Wt 234 lb (106.142 kg)  BMI 40.15 kg/m2  LMP 02/28/2015 (Approximate) UPT negative, Skin warm and dry.Pelvic: external genitalia is normal in appearance no lesions, vagina: scant discharge without odor,urethra has no lesions or masses noted, cervix:smooth and bulbous, uterus: normal size, shape and contour, mildly tender, no masses felt, adnexa: no masses or tenderness noted. Bladder is non tender and no masses felt.    Assessment:     Missed periods Weight gain    Plan:     Check CBC,CMP,TSH Rx provera 10 mg take 1 daily x 10 days, #10 Follow up in 2 weeks, if still no period may get [place] to R/O PCO

## 2015-06-24 NOTE — Patient Instructions (Signed)
Take provera 1 daily x 10 days  Follow up in 2 weeks

## 2015-06-25 ENCOUNTER — Telehealth: Payer: Self-pay | Admitting: Adult Health

## 2015-06-25 LAB — CBC
Hematocrit: 39.5 % (ref 34.0–46.6)
Hemoglobin: 13.3 g/dL (ref 11.1–15.9)
MCH: 30.5 pg (ref 26.6–33.0)
MCHC: 33.7 g/dL (ref 31.5–35.7)
MCV: 91 fL (ref 79–97)
PLATELETS: 304 10*3/uL (ref 150–379)
RBC: 4.36 x10E6/uL (ref 3.77–5.28)
RDW: 13.4 % (ref 12.3–15.4)
WBC: 11 10*3/uL — ABNORMAL HIGH (ref 3.4–10.8)

## 2015-06-25 LAB — COMPREHENSIVE METABOLIC PANEL
ALBUMIN: 4 g/dL (ref 3.5–5.5)
ALK PHOS: 99 IU/L (ref 39–117)
ALT: 30 IU/L (ref 0–32)
AST: 19 IU/L (ref 0–40)
Albumin/Globulin Ratio: 1.3 (ref 1.1–2.5)
BUN / CREAT RATIO: 8 (ref 8–20)
BUN: 7 mg/dL (ref 6–20)
Bilirubin Total: <0.2 mg/dL (ref 0.0–1.2)
CO2: 24 mmol/L (ref 18–29)
CREATININE: 0.85 mg/dL (ref 0.57–1.00)
Calcium: 9.8 mg/dL (ref 8.7–10.2)
Chloride: 97 mmol/L (ref 97–108)
GFR calc non Af Amer: 96 mL/min/{1.73_m2} (ref >59)
GFR, EST AFRICAN AMERICAN: 110 mL/min/{1.73_m2} (ref >59)
GLOBULIN, TOTAL: 3.1 g/dL (ref 1.5–4.5)
Glucose: 112 mg/dL — ABNORMAL HIGH (ref 65–99)
Potassium: 4.1 mmol/L (ref 3.5–5.2)
SODIUM: 141 mmol/L (ref 134–144)
TOTAL PROTEIN: 7.1 g/dL (ref 6.0–8.5)

## 2015-06-25 LAB — TSH: TSH: 1.08 u[IU]/mL (ref 0.450–4.500)

## 2015-06-25 NOTE — Telephone Encounter (Signed)
Pt aware of labs  

## 2015-07-08 ENCOUNTER — Ambulatory Visit (INDEPENDENT_AMBULATORY_CARE_PROVIDER_SITE_OTHER): Payer: 59 | Admitting: Adult Health

## 2015-07-08 ENCOUNTER — Encounter: Payer: Self-pay | Admitting: Adult Health

## 2015-07-08 VITALS — BP 122/84 | HR 100 | Ht 64.0 in | Wt 234.0 lb

## 2015-07-08 DIAGNOSIS — N926 Irregular menstruation, unspecified: Secondary | ICD-10-CM | POA: Diagnosis not present

## 2015-07-08 NOTE — Patient Instructions (Signed)
Get [place] 9/27 will talk

## 2015-07-08 NOTE — Progress Notes (Signed)
Subjective:     Patient ID: Melissa Lin, female   DOB: [birth year], 24 y.o.   MRN: [id number]  HPI Melissa Lin is a 24 year old black female back in for missing periods and then was given provera and did not have withdrawal blood.  Review of Systems Patient denies any headaches, hearing loss, fatigue, blurred vision, shortness of breath, chest pain, abdominal pain, problems with bowel movements, urination, or intercourse. No joint pain or mood swings.+missed periods Reviewed past medical,surgical, social and family history. Reviewed medications and allergies.     Objective:   Physical Exam BP 122/84 mmHg  Pulse 100  Ht  (1.626 m)  Wt 234 lb (106.142 kg)  BMI 40.15 kg/m2  LMP 02/28/2015 (Approximate) Skin warm and dry.  Lungs: clear to ausculation bilaterally. Cardiovascular: regular rate and rhythm.Face time 10 minutes with 50% counseling, since she did not have withdrawal bleed will get [place] to assess ovaries, may need to try OCs,but she is not thrilled with this, tried to explain, do not want her to go extended time without period, could cycle every 3 months with provera.Labs were normal.She says she is stressed, just wants to know why no period, also discussed trying metformin and weight loss.     Assessment:     Missed periods    Plan:     Return 9/27 for gyn [place] to assess ovaries to r/o PCO, will talk after [place] reviewed

## 2015-07-13 ENCOUNTER — Other Ambulatory Visit: Payer: 59

## 2015-07-13 ENCOUNTER — Telehealth: Payer: Self-pay | Admitting: *Deleted

## 2015-07-13 NOTE — Telephone Encounter (Signed)
Started period 9/24 and cancelled [place] today, keep period calendar and let me know when has next period

## 2015-12-01 ENCOUNTER — Encounter (HOSPITAL_COMMUNITY): Payer: Self-pay | Admitting: Emergency Medicine

## 2015-12-01 ENCOUNTER — Emergency Department (HOSPITAL_COMMUNITY)
Admission: EM | Admit: 2015-12-01 | Discharge: 2015-12-01 | Disposition: A | Discharge status: Home / Self Care | Payer: 59 | Attending: Emergency Medicine | Admitting: Emergency Medicine

## 2015-12-01 DIAGNOSIS — Z8742 Personal history of other diseases of the female genital tract: Secondary | ICD-10-CM | POA: Diagnosis not present

## 2015-12-01 DIAGNOSIS — G43909 Migraine, unspecified, not intractable, without status migrainosus: Secondary | ICD-10-CM | POA: Insufficient documentation

## 2015-12-01 DIAGNOSIS — G43009 Migraine without aura, not intractable, without status migrainosus: Secondary | ICD-10-CM

## 2015-12-01 DIAGNOSIS — K047 Periapical abscess without sinus: Secondary | ICD-10-CM

## 2015-12-01 DIAGNOSIS — K029 Dental caries, unspecified: Secondary | ICD-10-CM

## 2015-12-01 DIAGNOSIS — K0889 Other specified disorders of teeth and supporting structures: Secondary | ICD-10-CM | POA: Diagnosis present

## 2015-12-01 MED ORDER — HYDROCODONE-ACETAMINOPHEN 5-325 MG PO TABS
1.0000 | ORAL_TABLET | ORAL | Status: DC | PRN
Start: 2015-12-01 — End: 2016-01-31

## 2015-12-01 MED ORDER — AMOXICILLIN 500 MG PO CAPS: 500.0000 mg | ORAL_CAPSULE | Freq: Three times a day (TID) | ORAL | Status: DC

## 2015-12-01 MED ORDER — AMOXICILLIN 250 MG PO CAPS
500.0000 mg | ORAL_CAPSULE | Freq: Once | ORAL | Status: AC
  Administered 2015-12-01: 500 mg via ORAL
  Filled 2015-12-01: qty 2

## 2015-12-01 MED ORDER — HYDROCODONE-ACETAMINOPHEN 5-325 MG PO TABS: 1.0000 | ORAL_TABLET | ORAL | Status: DC | PRN

## 2015-12-01 MED ORDER — ONDANSETRON HCL 4 MG PO TABS
4.0000 mg | ORAL_TABLET | Freq: Once | ORAL | Status: AC
  Administered 2015-12-01: 4 mg via ORAL
  Filled 2015-12-01: qty 1

## 2015-12-01 MED ORDER — IBUPROFEN 800 MG PO TABS
800.0000 mg | ORAL_TABLET | Freq: Once | ORAL | Status: AC
  Administered 2015-12-01: 800 mg via ORAL
  Filled 2015-12-01: qty 1

## 2015-12-01 MED ORDER — IBUPROFEN 600 MG PO TABS: 600.0000 mg | ORAL_TABLET | Freq: Four times a day (QID) | ORAL | Status: DC | PRN

## 2015-12-01 NOTE — ED Notes (Signed)
Pt reports a persistent migraine x 3 days with light sensitivity. Pt also c/o LT sided dental pain. Denies v/d. Denies fever/chills.

## 2015-12-01 NOTE — ED Provider Notes (Signed)
CSN: [id number]     Arrival date & time 12/01/15  1532 History   First MD Initiated Contact with Patient 12/01/15 2055     Chief Complaint  Patient presents with  . Migraine  . Dental Pain     (Consider location/radiation/quality/duration/timing/severity/associated sxs/prior Treatment) Patient is a 25 y.o. female presenting with migraines and tooth pain. The history is provided by the patient.  Migraine This is a chronic problem. The current episode started in the past 7 days. The problem occurs intermittently. The problem has been gradually worsening. Associated symptoms include headaches and nausea. Pertinent negatives include no chills, fever, neck pain, numbness, vertigo or vomiting. Associated symptoms comments: Light sensitivity. Left dental pain. Exacerbated by: bright lights and loud noises.  Dental Pain Associated symptoms: headaches   Associated symptoms: no fever and no neck pain     Past Medical History  Diagnosis Date  . Migraines   . Missed periods 06/24/2015  . Weight gain 06/24/2015   Past Surgical History  Procedure Laterality Date  . Wisdom tooth extraction     Family History  Problem Relation Age of Onset  . Lupus Mother   . Diabetes Mother   . Hypertension Father   . Other Sister     high testosterone level  . Diabetes Sister     borderline  . Eczema Son   . Diabetes Maternal Grandmother   . Diabetes Paternal Grandmother   . Hypertension Paternal Grandfather   . Other Paternal Grandfather     had open heart surgery   Social History  Substance Use Topics  . Smoking status: Never Smoker   . Smokeless tobacco: Never Used  . Alcohol Use: Yes     Comment: on birthday's   OB History    Gravida Para Term Preterm AB TAB SAB Ectopic Multiple Living   1 1        1      Review of Systems  Constitutional: Negative for fever and chills.  HENT: Positive for dental problem.   Gastrointestinal: Positive for nausea. Negative for vomiting.  Musculoskeletal:  Negative for neck pain.  Neurological: Positive for headaches. Negative for vertigo and numbness.  All other systems reviewed and are negative.     Allergies  Review of patient's allergies indicates no known allergies.  Home Medications   Prior to Admission medications   Medication Sig Start Date End Date Taking? Authorizing Provider  acetaminophen (TYLENOL) 500 MG tablet Take 500 mg by mouth every 6 (six) hours as needed for mild pain, moderate pain or headache.   Yes Historical Provider, MD  ibuprofen (ADVIL,MOTRIN) 200 MG tablet Take 200 mg by mouth every 6 (six) hours as needed for mild pain or moderate pain.   Yes Historical Provider, MD  Ibuprofen-Diphenhydramine Cit (ADVIL PM) 200-38 MG TABS Take 2 tablets by mouth once.   Yes Historical Provider, MD  medroxyPROGESTERone (PROVERA) 10 MG tablet Take 1 tablet (10 mg total) by mouth daily. Patient not taking: Reported on 07/08/2015 06/24/15   Adline Potter, NP   BP 134/96 mmHg  Pulse 110  Temp(Src) 98.6 F (37 C) (Oral)  Resp 16  Wt 104.327 kg  SpO2 99%  LMP 07/09/2015 Physical Exam  Constitutional: She is oriented to person, place, and time. She appears well-developed and well-nourished.  Non-toxic appearance.  HENT:  Head: Normocephalic.  Right Ear: Tympanic membrane and external ear normal.  Left Ear: Tympanic membrane and external ear normal.  Deep cavity of the left upper molar.  Mild swelling about the gum. No abscess. Airway patent. No swelling under the tongue.  Eyes: EOM and lids are normal. Pupils are equal, round, and reactive to light.  Neck: Normal range of motion. Neck supple. Carotid bruit is not present.  Cardiovascular: Normal rate, regular rhythm, normal heart sounds, intact distal pulses and normal pulses.   Pulmonary/Chest: Breath sounds normal. No respiratory distress.  Abdominal: Soft. Bowel sounds are normal. There is no tenderness. There is no guarding.  Musculoskeletal: Normal range of motion.   Lymphadenopathy:       Head (right side): No submandibular adenopathy present.       Head (left side): No submandibular adenopathy present.    She has no cervical adenopathy.  Neurological: She is alert and oriented to person, place, and time. She has normal strength. No cranial nerve deficit or sensory deficit. She exhibits normal muscle tone. Coordination normal.  Gait steady. Good Balance. Speech clear.  Skin: Skin is warm and dry.  Psychiatric: She has a normal mood and affect. Her speech is normal.  Nursing note and vitals reviewed.   ED Course  Procedures (including critical care time) Labs Review Labs Reviewed - No data to display  Imaging Review No results found. I have personally reviewed and evaluated these images and lab results as part of my medical decision-making.   EKG Interpretation None      MDM  Vital signs reviewed. No evidence for Ludwig's angina or other acute changes. No acute neuro deficits. Rx for norco, amoxil, ibuprofen given to the patient. Pt strongly encouraged to see a dentist as soon as possible.   Final diagnoses:  Nonintractable migraine, unspecified migraine type  Infected dental caries    *I have reviewed nursing notes, vital signs, and all appropriate lab and imaging results for this patient.Ivery Quale, PA-C 12/02/15 1131  Bethann Berkshire, MD 12/02/15 [phone]

## 2015-12-01 NOTE — Discharge Instructions (Signed)
Migraine Headache NORCO MAY CAUSE DROWSINESS, USE THIS MEDICATION WITH CAUTION. SEE YOUR DENTIST AS SOON AS POSSIBLE.                                                                                                                      A migraine headache is an intense, throbbing pain on one or both sides of your head. A migraine can last for 30 minutes to several hours. CAUSES  The exact cause of a migraine headache is not always known. However, a migraine may be caused when nerves in the brain become irritated and release chemicals that cause inflammation. This causes pain. Certain things may also trigger migraines, such as:  Alcohol.  Smoking.  Stress.  Menstruation.  Aged cheeses.  Foods or drinks that contain nitrates, glutamate, aspartame, or tyramine.  Lack of sleep.  Chocolate.  Caffeine.  Hunger.  Physical exertion.  Fatigue.  Medicines used to treat chest pain (nitroglycerine), birth control pills, estrogen, and some blood pressure medicines. SIGNS AND SYMPTOMS  Pain on one or both sides of your head.  Pulsating or throbbing pain.  Severe pain that prevents daily activities.  Pain that is aggravated by any physical activity.  Nausea, vomiting, or both.  Dizziness.  Pain with exposure to bright lights, loud noises, or activity.  General sensitivity to bright lights, loud noises, or smells. Before you get a migraine, you may get warning signs that a migraine is coming (aura). An aura may include:  Seeing flashing lights.  Seeing bright spots, halos, or zigzag lines.  Having tunnel vision or blurred vision.  Having feelings of numbness or tingling.  Having trouble talking.  Having muscle weakness. DIAGNOSIS  A migraine headache is often diagnosed based on:  Symptoms.  Physical exam.  A CT scan or MRI of your head. These imaging tests cannot diagnose migraines, but they can help rule out other causes of headaches. TREATMENT Medicines may be  given for pain and nausea. Medicines can also be given to help prevent recurrent migraines.  HOME CARE INSTRUCTIONS  Only take over-the-counter or prescription medicines for pain or discomfort as directed by your health care provider. The use of long-term narcotics is not recommended.  Lie down in a dark, quiet room when you have a migraine.  Keep a journal to find out what may trigger your migraine headaches. For example, write down:  What you eat and drink.  How much sleep you get.  Any change to your diet or medicines.  Limit alcohol consumption.  Quit smoking if you smoke.  Get 7-9 hours of sleep, or as recommended by your health care provider.  Limit stress.  Keep lights dim if bright lights bother you and make your migraines worse. SEEK IMMEDIATE MEDICAL CARE IF:   Your migraine becomes severe.  You have a fever.  You have a stiff neck.  You have vision loss.  You have muscular weakness or loss of muscle control.  You start losing your balance or have  trouble walking.  You feel faint or pass out.  You have severe symptoms that are different from your first symptoms. MAKE SURE YOU:   Understand these instructions.  Will watch your condition.  Will get help right away if you are not doing well or get worse.   This information is not intended to replace advice given to you by your health care provider. Make sure you discuss any questions you have with your health care provider.   Document Released: 10/02/2005 Document Revised: 10/23/2014 Document Reviewed: 06/09/2013 Elsevier Interactive Patient Education 2016 Elsevier Inc.  Dental Caries Dental caries (also called tooth decay) is the most common oral disease. It can occur at any age but is more common in children and young adults.  HOW DENTAL CARIES DEVELOPS  The process of decay begins when bacteria and foods (particularly sugars and starches) combine in your mouth to produce plaque. Plaque is a  substance that sticks to the hard, outer surface of a tooth (enamel). The bacteria in plaque produce acids that attack enamel. These acids may also attack the root surface of a tooth (cementum) if it is exposed. Repeated attacks dissolve these surfaces and create holes in the tooth (cavities). If left untreated, the acids destroy the other layers of the tooth.  RISK FACTORS  Frequent sipping of sugary beverages.   Frequent snacking on sugary and starchy foods, especially those that easily get stuck in the teeth.   Poor oral hygiene.   Dry mouth.   Substance abuse such as methamphetamine abuse.   Broken or poor-fitting dental restorations.   Eating disorders.   Gastroesophageal reflux disease (GERD).   Certain radiation treatments to the head and neck. SYMPTOMS In the early stages of dental caries, symptoms are seldom present. Sometimes white, chalky areas may be seen on the enamel or other tooth layers. In later stages, symptoms may include:  Pits and holes on the enamel.  Toothache after sweet, hot, or cold foods or drinks are consumed.  Pain around the tooth.  Swelling around the tooth. DIAGNOSIS  Most of the time, dental caries is detected during a regular dental checkup. A diagnosis is made after a thorough medical and dental history is taken and the surfaces of your teeth are checked for signs of dental caries. Sometimes special instruments, such as lasers, are used to check for dental caries. Dental X-ray exams may be taken so that areas not visible to the eye (such as between the contact areas of the teeth) can be checked for cavities.  TREATMENT  If dental caries is in its early stages, it may be reversed with a fluoride treatment or an application of a remineralizing agent at the dental office. Thorough brushing and flossing at home is needed to aid these treatments. If it is in its later stages, treatment depends on the location and extent of tooth destruction:    If a small area of the tooth has been destroyed, the destroyed area will be removed and cavities will be filled with a material such as gold, silver amalgam, or composite resin.   If a large area of the tooth has been destroyed, the destroyed area will be removed and a cap (crown) will be fitted over the remaining tooth structure.   If the center part of the tooth (pulp) is affected, a procedure called a root canal will be needed before a filling or crown can be placed.   If most of the tooth has been destroyed, the tooth may need  to be pulled (extracted). HOME CARE INSTRUCTIONS You can prevent, stop, or reverse dental caries at home by practicing good oral hygiene. Good oral hygiene includes:  Thoroughly cleaning your teeth at least twice a day with a toothbrush and dental floss.   Using a fluoride toothpaste. A fluoride mouth rinse may also be used if recommended by your dentist or health care provider.   Restricting the amount of sugary and starchy foods and sugary liquids you consume.   Avoiding frequent snacking on these foods and sipping of these liquids.   Keeping regular visits with a dentist for checkups and cleanings. PREVENTION   Practice good oral hygiene.  Consider a dental sealant. A dental sealant is a coating material that is applied by your dentist to the pits and grooves of teeth. The sealant prevents food from being trapped in them. It may protect the teeth for several years.  Ask about fluoride supplements if you live in a community without fluorinated water or with water that has a low fluoride content. Use fluoride supplements as directed by your dentist or health care provider.  Allow fluoride varnish applications to teeth if directed by your dentist or health care provider.   This information is not intended to replace advice given to you by your health care provider. Make sure you discuss any questions you have with your health care provider.    Document Released: 06/24/2002 Document Revised: 10/23/2014 Document Reviewed: 10/04/2012 Elsevier Interactive Patient Education [employer]

## 2015-12-07 MED FILL — Hydrocodone-Acetaminophen Tab 5-325 MG: ORAL | Qty: 6 | Status: AC

## 2015-12-31 ENCOUNTER — Encounter: Payer: Self-pay | Admitting: Nurse Practitioner

## 2015-12-31 ENCOUNTER — Ambulatory Visit (INDEPENDENT_AMBULATORY_CARE_PROVIDER_SITE_OTHER): Payer: 59 | Admitting: Nurse Practitioner

## 2015-12-31 VITALS — BP 122/82 | Ht 64.0 in | Wt 237.0 lb

## 2015-12-31 DIAGNOSIS — L83 Acanthosis nigricans: Secondary | ICD-10-CM | POA: Diagnosis not present

## 2015-12-31 DIAGNOSIS — L680 Hirsutism: Secondary | ICD-10-CM

## 2015-12-31 DIAGNOSIS — N912 Amenorrhea, unspecified: Secondary | ICD-10-CM | POA: Diagnosis not present

## 2015-12-31 DIAGNOSIS — Z124 Encounter for screening for malignant neoplasm of cervix: Secondary | ICD-10-CM

## 2015-12-31 DIAGNOSIS — Z0000 Encounter for general adult medical examination without abnormal findings: Secondary | ICD-10-CM | POA: Diagnosis not present

## 2015-12-31 DIAGNOSIS — Z113 Encounter for screening for infections with a predominantly sexual mode of transmission: Secondary | ICD-10-CM

## 2015-12-31 DIAGNOSIS — Z139 Encounter for screening, unspecified: Secondary | ICD-10-CM | POA: Diagnosis not present

## 2015-12-31 DIAGNOSIS — Z01419 Encounter for gynecological examination (general) (routine) without abnormal findings: Secondary | ICD-10-CM

## 2015-12-31 LAB — POCT URINE PREGNANCY: Preg Test, Ur: NEGATIVE

## 2015-12-31 MED ORDER — NORETHINDRONE ACET-ETHINYL EST 1-20 MG-MCG PO TABS: 1.0000 | ORAL_TABLET | Freq: Every day | ORAL | Status: DC

## 2015-12-31 MED ORDER — PHENTERMINE HCL 37.5 MG PO TABS: 37.5000 mg | ORAL_TABLET | Freq: Every day | ORAL | Status: DC

## 2016-01-01 ENCOUNTER — Encounter: Payer: Self-pay | Admitting: Nurse Practitioner

## 2016-01-01 DIAGNOSIS — N912 Amenorrhea, unspecified: Secondary | ICD-10-CM | POA: Insufficient documentation

## 2016-01-01 DIAGNOSIS — L83 Acanthosis nigricans: Secondary | ICD-10-CM | POA: Insufficient documentation

## 2016-01-01 DIAGNOSIS — L680 Hirsutism: Secondary | ICD-10-CM | POA: Insufficient documentation

## 2016-01-01 NOTE — Progress Notes (Signed)
Subjective:    Patient ID: Melissa Lin, female    DOB: [birth year], 25 y.o.   MRN: [id number]  HPI presents for her wellness exam. Same sexual partner. Has not had intercourse in over a week. Has had a negative pregnancy test at home. Defers need for birth control, is trying to conceive at this point but has not had a cycle in months, was given Provera by Cyril Mourning NP back in September, states she had a light cycle on 9/23. None since. Has plans to start dental care. Active lifestyle but no regular exercise. States she gained tremendous amounts of weight while being on Depo-Provera. Has been off of this well over a year. Has been treated for chlamydia once in the past with her partner cheated on her. FMH: Her mother is diabetic.    Review of Systems  Constitutional: Negative for fever, activity change, appetite change and fatigue.  HENT: Negative for dental problem, ear pain, sinus pressure and sore throat.   Eyes: Negative for visual disturbance.  Respiratory: Negative for cough, chest tightness, shortness of breath and wheezing.   Cardiovascular: Negative for chest pain.  Gastrointestinal: Negative for nausea, vomiting, abdominal pain, diarrhea, constipation and abdominal distention.  Genitourinary: Positive for menstrual problem. Negative for dysuria, urgency, frequency, vaginal bleeding, vaginal discharge, enuresis, difficulty urinating, genital sores and pelvic pain.       Objective:   Physical Exam  Constitutional: She is oriented to person, place, and time. She appears well-developed. No distress.  HENT:  Right Ear: External ear normal.  Left Ear: External ear normal.  Mouth/Throat: Oropharynx is clear and moist.  Neck: Normal range of motion. Neck supple. No tracheal deviation present. No thyromegaly present.  Cardiovascular: Normal rate, regular rhythm and normal heart sounds.  Exam reveals no gallop.   No murmur heard. Pulmonary/Chest: Effort normal and breath sounds  normal.  Abdominal: Soft. She exhibits no distension. There is no tenderness.  Genitourinary: Vagina normal and uterus normal. No vaginal discharge found.  External GU no rashes or lesions. Vagina no discharge. Cervix normal limit in appearance. No CMT. Bimanual exam no tenderness or obvious masses but exam limited due to abdominal girth.  Musculoskeletal: She exhibits no edema.  Lymphadenopathy:    She has no cervical adenopathy.  Neurological: She is alert and oriented to person, place, and time.  Skin: Skin is warm and dry. No rash noted.  Moderate acanthosis nigricans can't changes noted on the posterior neck area. Slight excessive hair growth noted on the upper chest and neck area.  Psychiatric: She has a normal mood and affect. Her behavior is normal.  Breast exam: Areas of dense tissue, minimal fine nodularity. Exam no adenopathy. Results for orders placed or performed in visit on 12/31/15  POCT urine pregnancy  Result Value Ref Range   Preg Test, Ur Negative Negative         Assessment & Plan:   Problem List Items Addressed This Visit      Musculoskeletal and Integument   Acanthosis nigricans   Relevant Orders   Insulin, Free and Total   Hirsutism   Relevant Orders   Testosterone     Other   Amenorrhea   Relevant Orders   POCT urine pregnancy (Completed)   Morbid obesity (HCC)   Relevant Medications   phentermine (ADIPEX-P) 37.5 MG tablet    Other Visit Diagnoses    Well woman exam    -  Primary    Relevant Orders  Pap IG, CT/NG w/ reflex HPV when ASC-U    Lipid panel    Screening for cervical cancer        Relevant Orders    Pap IG, CT/NG w/ reflex HPV when ASC-U    Screen for STD (sexually transmitted disease)        Relevant Orders    Pap IG, CT/NG w/ reflex HPV when ASC-U    Screening        Relevant Orders    Lipid panel      Meds ordered this encounter  Medications         . norethindrone-ethinyl estradiol (MICROGESTIN,JUNEL,LOESTRIN) 1-20  MG-MCG tablet    Sig: Take 1 tablet by mouth daily.    Dispense:  1 Package    Refill:  11    Order Specific Question:  Supervising Provider    Answer:  Merlyn Albert [[id number]]  . phentermine (ADIPEX-P) 37.5 MG tablet    Sig: Take 1 tablet (37.5 mg total) by mouth daily before breakfast.    Dispense:  30 tablet    Refill:  0    Order Specific Question:  Supervising Provider    Answer:  Merlyn Albert [[id number]]   Discussed options at length. Patient agrees to a trial of birth control pills for the next 3-4 months to regulate cycle and stimulate her menses. Discussed risk with prolonged amenorrhea. While she is on birth control, she may take phentermine to help with weight loss. Reviewed potential adverse effects, DC med and call if any problems. Recommend patient take 1 more pregnancy test in one week to verify negative status. Discussed the importance of regular exercise and weight loss. Strongly suspect PCOS. Lab work pending. Recheck in one month, call back sooner if any problems.

## 2016-01-03 LAB — PAP IG, CT-NG, RFX HPV ASCU
CHLAMYDIA, NUC. ACID AMP: NEGATIVE
Gonococcus by Nucleic Acid Amp: NEGATIVE
PAP Smear Comment: 0

## 2016-01-12 ENCOUNTER — Encounter: Payer: Self-pay | Admitting: Nurse Practitioner

## 2016-01-19 DIAGNOSIS — Z0000 Encounter for general adult medical examination without abnormal findings: Secondary | ICD-10-CM | POA: Diagnosis not present

## 2016-01-19 DIAGNOSIS — L680 Hirsutism: Secondary | ICD-10-CM | POA: Diagnosis not present

## 2016-01-19 DIAGNOSIS — Z139 Encounter for screening, unspecified: Secondary | ICD-10-CM | POA: Diagnosis not present

## 2016-01-24 LAB — LIPID PANEL
CHOLESTEROL TOTAL: 181 mg/dL (ref 100–199)
Chol/HDL Ratio: 6 ratio units — ABNORMAL HIGH (ref 0.0–4.4)
HDL: 30 mg/dL — ABNORMAL LOW (ref >39)
LDL Calculated: 132 mg/dL — ABNORMAL HIGH (ref 0–99)
Triglycerides: 97 mg/dL (ref 0–149)
VLDL Cholesterol Cal: 19 mg/dL (ref 5–40)

## 2016-01-24 LAB — TESTOSTERONE: TESTOSTERONE: 42 ng/dL (ref 8–48)

## 2016-01-24 LAB — INSULIN, FREE AND TOTAL
FREE INSULIN: 23 uU/mL
TOTAL INSULIN: 23 uU/mL

## 2016-01-31 ENCOUNTER — Encounter: Payer: Self-pay | Admitting: Nurse Practitioner

## 2016-01-31 ENCOUNTER — Ambulatory Visit (INDEPENDENT_AMBULATORY_CARE_PROVIDER_SITE_OTHER): Payer: 59 | Admitting: Nurse Practitioner

## 2016-01-31 VITALS — BP 112/80 | Ht 64.0 in | Wt 226.4 lb

## 2016-01-31 DIAGNOSIS — E282 Polycystic ovarian syndrome: Secondary | ICD-10-CM | POA: Diagnosis not present

## 2016-01-31 DIAGNOSIS — B001 Herpesviral vesicular dermatitis: Secondary | ICD-10-CM | POA: Diagnosis not present

## 2016-01-31 MED ORDER — VALACYCLOVIR HCL 1 G PO TABS: ORAL_TABLET | ORAL | Status: DC

## 2016-01-31 MED ORDER — PHENTERMINE HCL 37.5 MG PO TABS: 37.5000 mg | ORAL_TABLET | Freq: Every day | ORAL | Status: DC

## 2016-01-31 NOTE — Progress Notes (Signed)
Subjective:  Presents for recheck on her PCOS and weight. Has started her birth control pills. So far going well. Has lost about 11 pounds. Has started a walking program with her husband. Has decreased her calorie intake. Denies any adverse effects on phentermine. Also mentions a lesion on her right upper lip for the past 48 hours, has a history of fever blisters.  Objective:   BP 112/80 mmHg  Ht  (1.626 m)  Wt 226 lb 6 oz (102.683 kg)  BMI 38.84 kg/m2 NAD. Alert, oriented. Lungs clear. Heart regular rate rhythm. Has lost 11 pounds since her last visit. 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  Assessment:  Problem List Items Addressed This Visit      Endocrine   PCOS (polycystic ovarian syndrome) - Primary     Other   Morbid obesity (HCC)   Relevant Medications   phentermine (ADIPEX-P) 37.5 MG tablet    Other Visit Diagnoses    Fever blister        Relevant Medications    valACYclovir (VALTREX) 1000 MG tablet      Plan:  Meds ordered this encounter  Medications  . phentermine (ADIPEX-P) 37.5 MG tablet    Sig: Take 1 tablet (37.5 mg total) by mouth daily before breakfast.    Dispense:  30 tablet    Refill:  2    Order Specific Question:  Supervising Provider    Answer:  Merlyn Albert [[id number]]  . valACYclovir (VALTREX) 1000 MG tablet    Sig: Take 2 pills po BID x 2 doses prn fever blisters    Dispense:  4 tablet    Refill:  5    Order Specific Question:  Supervising Provider    Answer:  Merlyn Albert [[id number]]   Continue phentermine as directed. Encouraged controlling caloric intake and increasing activity. Valacyclovir as directed when necessary fever blisters. Return in about 3 months (around 05/01/2016) for recheck. Call back sooner if any problems.

## 2016-02-03 ENCOUNTER — Encounter: Payer: Self-pay | Admitting: Nurse Practitioner

## 2016-05-06 ENCOUNTER — Emergency Department (HOSPITAL_COMMUNITY)
Admission: EM | Admit: 2016-05-06 | Discharge: 2016-05-06 | Disposition: A | Discharge status: Home / Self Care | Payer: 59 | Attending: Emergency Medicine | Admitting: Emergency Medicine

## 2016-05-06 ENCOUNTER — Encounter (HOSPITAL_COMMUNITY): Payer: Self-pay | Admitting: *Deleted

## 2016-05-06 DIAGNOSIS — Z79899 Other long term (current) drug therapy: Secondary | ICD-10-CM | POA: Insufficient documentation

## 2016-05-06 DIAGNOSIS — K0889 Other specified disorders of teeth and supporting structures: Secondary | ICD-10-CM | POA: Diagnosis present

## 2016-05-06 DIAGNOSIS — K029 Dental caries, unspecified: Secondary | ICD-10-CM | POA: Insufficient documentation

## 2016-05-06 DIAGNOSIS — Z791 Long term (current) use of non-steroidal anti-inflammatories (NSAID): Secondary | ICD-10-CM | POA: Diagnosis not present

## 2016-05-06 MED ORDER — IBUPROFEN 400 MG PO TABS
600.0000 mg | ORAL_TABLET | Freq: Once | ORAL | Status: AC
  Administered 2016-05-06: 600 mg via ORAL
  Filled 2016-05-06: qty 2

## 2016-05-06 MED ORDER — PENICILLIN V POTASSIUM 250 MG PO TABS
500.0000 mg | ORAL_TABLET | Freq: Once | ORAL | Status: AC
  Administered 2016-05-06: 500 mg via ORAL
  Filled 2016-05-06: qty 2

## 2016-05-06 MED ORDER — PENICILLIN V POTASSIUM 500 MG PO TABS: 500.0000 mg | ORAL_TABLET | Freq: Four times a day (QID) | ORAL | Status: DC

## 2016-05-06 MED ORDER — ACETAMINOPHEN 500 MG PO TABS
1000.0000 mg | ORAL_TABLET | Freq: Once | ORAL | Status: AC
  Administered 2016-05-06: 1000 mg via ORAL
  Filled 2016-05-06: qty 2

## 2016-05-06 NOTE — ED Notes (Signed)
Pt reports dental  abscess to the top left mouth

## 2016-05-06 NOTE — Discharge Instructions (Signed)

## 2016-05-06 NOTE — ED Provider Notes (Signed)
CSN: [id number]     Arrival date & time 05/06/16  8563 History   First MD Initiated Contact with Patient 05/06/16 0547 AM   Chief Complaint  Patient presents with  . Dental Pain     (Consider location/radiation/quality/duration/timing/severity/associated sxs/prior Treatment) HPI  patient states she started having pain in her left upper tooth yesterday, July 21. She took ibuprofen 600 mg 8 PM without relief. She states she does not have a regular dentist but did have adenosine cold or 2 for her in the past. She denies fever, difficulty swallowing or breathing. She does have some mild swelling of her face.   PCP Dr Gerda Diss  Past Medical History  Diagnosis Date  . Migraines   . Missed periods 06/24/2015  . Weight gain 06/24/2015   Past Surgical History  Procedure Laterality Date  . Wisdom tooth extraction     Family History  Problem Relation Age of Onset  . Lupus Mother   . Diabetes Mother   . Hypertension Father   . Other Sister     high testosterone level  . Diabetes Sister     borderline  . Eczema Son   . Diabetes Maternal Grandmother   . Diabetes Paternal Grandmother   . Hypertension Paternal Grandfather   . Other Paternal Grandfather     had open heart surgery   Social History  Substance Use Topics  . Smoking status: Never Smoker   . Smokeless tobacco: Never Used  . 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  OB History    Gravida Para Term Preterm AB TAB SAB Ectopic Multiple Living   1 1        1      Review of Systems  All other systems reviewed and are negative.     Allergies  Review of patient's allergies indicates no known allergies.  Home Medications   Prior to Admission medications   Medication Sig Start Date End Date Taking? Authorizing Provider  acetaminophen (TYLENOL) 500 MG tablet Take 500 mg by mouth every 6 (six) hours as needed for mild pain, moderate pain or headache. Reported on 12/31/2015   Yes  Historical Provider, MD  ibuprofen (ADVIL,MOTRIN) 600 MG tablet Take 1 tablet (600 mg total) by mouth every 6 (six) hours as needed. 12/01/15  Yes Ivery Quale, PA-C  clindamycin (CLEOCIN) 150 MG capsule Take 150 mg by mouth 4 (four) times daily. Reported on 01/31/2016    Historical Provider, MD  Ibuprofen-Diphenhydramine Cit (ADVIL PM) 200-38 MG TABS Take 2 tablets by mouth once. Reported on 01/31/2016    Historical Provider, MD  norethindrone-ethinyl estradiol (MICROGESTIN,JUNEL,LOESTRIN) 1-20 MG-MCG tablet Take 1 tablet by mouth daily. 12/31/15   Campbell Riches, NP  penicillin v potassium (VEETID) 500 MG tablet Take 1 tablet (500 mg total) by mouth 4 (four) times daily. 05/06/16   Devoria Albe, MD  phentermine (ADIPEX-P) 37.5 MG tablet Take 1 tablet (37.5 mg total) by mouth daily before breakfast. 01/31/16   Campbell Riches, NP  valACYclovir (VALTREX) 1000 MG tablet Take 2 pills po BID x 2 doses prn fever blisters 01/31/16   Campbell Riches, NP   BP 130/93 mmHg  Pulse 68  Temp(Src) 97.7 F (36.5 C) (Oral)  Resp 18  Ht 5\' 4"  (1.626 m)  Wt 225 lb (102.059 kg)  BMI 38.60 kg/m2  SpO2 100%  Vital signs normal   Physical Exam  Constitutional: She is oriented  to person, place, and time. She appears well-developed and well-nourished.  Non-toxic appearance. She does not appear ill. No distress.  HENT:  Head: Normocephalic and atraumatic.  Right Ear: External ear normal.  Left Ear: External ear normal.  Nose: Nose normal. No mucosal edema or rhinorrhea.  Mouth/Throat: Oropharynx is clear and moist and mucous membranes are normal. No dental abscesses or uvula swelling.    Patient has minimal left facial swelling around tooth  Eyes: Conjunctivae and EOM are normal.  Neck: Normal range of motion and full passive range of motion without pain.  Cardiovascular: Normal rate.   Pulmonary/Chest: No respiratory distress. She has no rhonchi. She exhibits no crepitus.  Abdominal: Normal appearance.    Musculoskeletal: Normal range of motion.  Moves all extremities well.   Neurological: She is alert and oriented to person, place, and time. She has normal strength. No cranial nerve deficit.  Skin: Skin is warm, dry and intact. No rash noted. No erythema. No pallor.  Psychiatric: She has a normal mood and affect. Her speech is normal and behavior is normal. Her mood appears not anxious.  Nursing note and vitals reviewed.   ED Course  Procedures (including critical care time)  Medications  penicillin v potassium (VEETID) tablet 500 mg (500 mg Oral Given 05/06/16 0558)  acetaminophen (TYLENOL) tablet 1,000 mg (1,000 mg Oral Given 05/06/16 0558)  ibuprofen (ADVIL,MOTRIN) tablet 600 mg (600 mg Oral Given 05/06/16 0558)    Patient was given ibuprofen and Tylenol for pain. She was started on penicillin for her tooth.   MDM   Final diagnoses:  Dental caries    New Prescriptions   PENICILLIN V POTASSIUM (VEETID) 500 MG TABLET    Take 1 tablet (500 mg total) by mouth 4 (four) times daily.    Plan discharge  Devoria Albe, MD, Concha Pyo, MD 05/06/16 [phone]

## 2016-05-06 NOTE — ED Notes (Signed)
Pt alert & oriented x4, stable gait. Patient  given discharge instructions, paperwork & prescription(s). Patient verbalized understanding. Pt left department w/ no further questions. 

## 2016-08-15 ENCOUNTER — Encounter: Payer: Self-pay | Admitting: Family Medicine

## 2016-08-15 ENCOUNTER — Ambulatory Visit (HOSPITAL_COMMUNITY)
Admission: RE | Admit: 2016-08-15 | Discharge: 2016-08-15 | Disposition: A | Discharge status: Home / Self Care | Payer: 59 | Source: Ambulatory Visit | Attending: Family Medicine | Admitting: Family Medicine

## 2016-08-15 ENCOUNTER — Ambulatory Visit (INDEPENDENT_AMBULATORY_CARE_PROVIDER_SITE_OTHER): Payer: 59 | Admitting: Family Medicine

## 2016-08-15 VITALS — BP 112/76 | Temp 98.5°F | Ht 64.0 in | Wt 247.1 lb

## 2016-08-15 DIAGNOSIS — M25562 Pain in left knee: Secondary | ICD-10-CM

## 2016-08-15 DIAGNOSIS — M7989 Other specified soft tissue disorders: Secondary | ICD-10-CM | POA: Diagnosis not present

## 2016-08-15 MED ORDER — ETODOLAC 400 MG PO TABS: ORAL_TABLET | ORAL | 0 refills | Status: DC

## 2016-08-15 NOTE — Progress Notes (Signed)
   Subjective:    Patient ID: Melissa Lin, female    DOB: April 24, 1991, 25 y.o.   MRN: [id number]  Knee Pain   The incident occurred 3 to 5 days ago. The incident occurred at home. The injury mechanism was a twisting injury. The pain is present in the left knee. Quality: Dull,numb  She reports no foreign bodies present. The symptoms are aggravated by movement and weight bearing.   Pt noted sig popping sound when she pivoted on it  Took bc powders for arthritis pain  Taking ibuprofen as needed  Patient states no other concerns this visit.   Review of Systems No headache, no major weight loss or weight gain, no chest pain no back pain abdominal pain no change in bowel habits complete ROS otherwise negative     Objective:   Physical Exam  Alert vitals stable, NAD. Blood pressure good on repeat. HEENT normal. Lungs clear. Heart regular rate and rhythm. Knee good range of motion no obvious effusion no obvious joint laxity no point tenderness some pain with complete extension      Assessment & Plan:  Impression knee sprain plan x-ray knee. Anti-inflammatory medicine prescribed symptom care discussed if persists in the next week recommend reassessment

## 2016-08-16 MED FILL — ETODOLAC 400 MG TABLET: 400 | 10 days supply | Qty: 20 | Fill #0

## 2016-08-20 ENCOUNTER — Encounter: Payer: Self-pay | Admitting: Family Medicine

## 2016-08-21 MED ORDER — MELOXICAM 15 MG PO TABS: 15.0000 mg | ORAL_TABLET | Freq: Every day | ORAL | 0 refills | Status: DC | PRN

## 2016-08-23 MED ORDER — MECLIZINE HCL 25 MG PO TABS: 25.0000 mg | ORAL_TABLET | Freq: Three times a day (TID) | ORAL | 0 refills | Status: DC | PRN

## 2016-10-19 ENCOUNTER — Encounter: Payer: Self-pay | Admitting: Family Medicine

## 2016-10-19 ENCOUNTER — Ambulatory Visit (INDEPENDENT_AMBULATORY_CARE_PROVIDER_SITE_OTHER): Payer: 59 | Admitting: Family Medicine

## 2016-10-19 VITALS — BP 110/80 | Temp 98.6°F | Ht 64.0 in | Wt 248.4 lb

## 2016-10-19 DIAGNOSIS — G43819 Other migraine, intractable, without status migrainosus: Secondary | ICD-10-CM | POA: Diagnosis not present

## 2016-10-19 MED ORDER — TIZANIDINE HCL 4 MG PO TABS: ORAL_TABLET | ORAL | 1 refills | Status: DC

## 2016-10-19 MED ORDER — RIZATRIPTAN BENZOATE 10 MG PO TABS: 10.0000 mg | ORAL_TABLET | ORAL | 2 refills | Status: DC | PRN

## 2016-10-19 NOTE — Progress Notes (Signed)
   Subjective:    Patient ID: Melissa Lin, female    DOB: 1991/03/03, 26 y.o.   MRN: [id number]  Headache   This is a new problem. The current episode started in the past 7 days. The problem occurs intermittently. The problem has been unchanged. The pain does not radiate. The quality of the pain is described as aching. The pain is moderate. Associated symptoms include coughing and ear pain. Associated symptoms comments: Sneezing, runny nose. Nothing aggravates the symptoms. She has tried acetaminophen and NSAIDs for the symptoms. The treatment provided no relief.  Over the past several months she is had frequent headaches she is had migraines ever since been a teenager but recently they've become more frequent their last 3-4 days at length they'll cause nausea but no vomiting they do not wake her up in the middle the night but do not occur first thing in the morning. She denies any numbness or weakness with that she does not have a or with it. She denies any sweats chills nausea or vomiting the headaches happen at least 4 to 5 times per month. She states she is tried Topamax in the past and it did not help much Patient has no other concerns at this time.    Review of Systems  HENT: Positive for ear pain.   Respiratory: Positive for cough.   Neurological: Positive for headaches.       Objective:   Physical Exam Pupils responsive to light EOMI no unilateral numbness or weakness finger to nose is normal lungs clear heart regular       Assessment & Plan:  Migraine-supportive measures discuss try Maxalt. Also try muscle relaxers when at home. Because of frequent migraines and did not respond well to Topamax referral to neurology for further consultation

## 2016-10-23 ENCOUNTER — Encounter: Payer: Self-pay | Admitting: Family Medicine

## 2016-10-26 DIAGNOSIS — Z029 Encounter for administrative examinations, unspecified: Secondary | ICD-10-CM

## 2016-11-03 ENCOUNTER — Other Ambulatory Visit: Payer: Self-pay | Admitting: *Deleted

## 2016-11-03 MED ORDER — TIZANIDINE HCL 4 MG PO TABS: ORAL_TABLET | ORAL | 1 refills | Status: DC

## 2016-11-03 MED ORDER — RIZATRIPTAN BENZOATE 10 MG PO TABS: 10.0000 mg | ORAL_TABLET | ORAL | 2 refills | Status: DC | PRN

## 2016-11-06 MED FILL — tiZANidine HCL 4 MG TABS: 4 | 6 days supply | Qty: 24 | Fill #0

## 2016-11-06 MED FILL — RIZATRIPTAN 10 MG TABLET: 10 | 24 days supply | Qty: 10 | Fill #0

## 2016-11-13 DIAGNOSIS — E6609 Other obesity due to excess calories: Secondary | ICD-10-CM | POA: Diagnosis not present

## 2016-11-13 DIAGNOSIS — G5602 Carpal tunnel syndrome, left upper limb: Secondary | ICD-10-CM | POA: Diagnosis not present

## 2016-11-13 DIAGNOSIS — G5601 Carpal tunnel syndrome, right upper limb: Secondary | ICD-10-CM | POA: Diagnosis not present

## 2016-11-13 DIAGNOSIS — G43711 Chronic migraine without aura, intractable, with status migrainosus: Secondary | ICD-10-CM | POA: Diagnosis not present

## 2016-11-13 DIAGNOSIS — R5383 Other fatigue: Secondary | ICD-10-CM | POA: Diagnosis not present

## 2016-11-24 ENCOUNTER — Ambulatory Visit (INDEPENDENT_AMBULATORY_CARE_PROVIDER_SITE_OTHER): Payer: 59 | Admitting: Family Medicine

## 2016-11-24 ENCOUNTER — Encounter: Payer: Self-pay | Admitting: Family Medicine

## 2016-11-24 VITALS — BP 110/80 | Temp 98.5°F | Ht 64.0 in | Wt 242.0 lb

## 2016-11-24 DIAGNOSIS — J329 Chronic sinusitis, unspecified: Secondary | ICD-10-CM

## 2016-11-24 DIAGNOSIS — J310 Chronic rhinitis: Secondary | ICD-10-CM

## 2016-11-24 DIAGNOSIS — H6503 Acute serous otitis media, bilateral: Secondary | ICD-10-CM | POA: Diagnosis not present

## 2016-11-24 MED ORDER — AMOXICILLIN-POT CLAVULANATE 875-125 MG PO TABS: 1.0000 | ORAL_TABLET | Freq: Two times a day (BID) | ORAL | 0 refills | Status: DC

## 2016-11-24 NOTE — Progress Notes (Signed)
   Subjective:    Patient ID: Melissa Lin, female    DOB: 1990/11/30, 26 y.o.   MRN: [id number]  Otalgia   There is pain in both ears. This is a new problem. The current episode started in the past 7 days.   Last tue night   Fe,lt like had fee atr the start  Dim energy  Chills   Bad cough and congestion for a few months  Took alk plus   Still coughing ome,   Ear painful and ringing  Feels muffled, painful at times, stabing and rining     Patient states no other concerns this visit. Review of Systems  HENT: Positive for ear pain.        Objective:   Physical Exam Alert, mild malaise. Hydration good Vitals stable. frontal/ maxillary tenderness evident positive nasal congestion. pharynx normal neck supple  lungs clear/no crackles or wheezes. heart regular in rhythm        Assessment & Plan:  Impression rhinosinusitis likely post viral, discussed with patient. plan antibiotics prescribed. Questions answered. Symptomatic care discussed. warning signs discussed. WSL Who is on/bilateral otitis media

## 2016-11-24 NOTE — Progress Notes (Signed)
   Subjective:    Patient ID: Melissa Lin, female    DOB: [birth year], 26 y.o.   MRN: [id number]  Otalgia        Review of Systems  HENT: Positive for ear pain.        Objective:   Physical Exam        Assessment & Plan:

## 2016-11-28 MED FILL — TOPIRAMATE 25 MG TABLET: 25 | 30 days supply | Qty: 60 | Fill #0

## 2016-12-26 DIAGNOSIS — G5601 Carpal tunnel syndrome, right upper limb: Secondary | ICD-10-CM | POA: Diagnosis not present

## 2016-12-26 DIAGNOSIS — G5602 Carpal tunnel syndrome, left upper limb: Secondary | ICD-10-CM | POA: Diagnosis not present

## 2016-12-26 DIAGNOSIS — E6609 Other obesity due to excess calories: Secondary | ICD-10-CM | POA: Diagnosis not present

## 2016-12-26 DIAGNOSIS — G43711 Chronic migraine without aura, intractable, with status migrainosus: Secondary | ICD-10-CM | POA: Diagnosis not present

## 2016-12-26 DIAGNOSIS — R5383 Other fatigue: Secondary | ICD-10-CM | POA: Diagnosis not present

## 2017-01-05 ENCOUNTER — Encounter: Payer: Self-pay | Admitting: Nurse Practitioner

## 2017-01-08 ENCOUNTER — Encounter: Payer: Self-pay | Admitting: Nurse Practitioner

## 2017-01-15 ENCOUNTER — Encounter: Payer: Self-pay | Admitting: Nurse Practitioner

## 2017-02-01 ENCOUNTER — Encounter: Payer: Self-pay | Admitting: Nurse Practitioner

## 2017-02-01 ENCOUNTER — Ambulatory Visit (INDEPENDENT_AMBULATORY_CARE_PROVIDER_SITE_OTHER): Payer: 59 | Admitting: Nurse Practitioner

## 2017-02-01 VITALS — BP 112/82 | Temp 98.1°F | Ht 64.0 in | Wt 258.0 lb

## 2017-02-01 DIAGNOSIS — N912 Amenorrhea, unspecified: Secondary | ICD-10-CM

## 2017-02-01 DIAGNOSIS — E282 Polycystic ovarian syndrome: Secondary | ICD-10-CM | POA: Diagnosis not present

## 2017-02-01 LAB — POCT URINE PREGNANCY: PREG TEST UR: NEGATIVE

## 2017-02-01 LAB — POCT GLYCOSYLATED HEMOGLOBIN (HGB A1C): Hemoglobin A1C: 5.1

## 2017-02-01 MED ORDER — METFORMIN HCL 500 MG PO TABS: 500.0000 mg | ORAL_TABLET | Freq: Two times a day (BID) | ORAL | 2 refills | Status: DC

## 2017-02-01 MED ORDER — NORETHIN ACE-ETH ESTRAD-FE 1-20 MG-MCG PO TABS: 1.0000 | ORAL_TABLET | Freq: Every day | ORAL | 11 refills | Status: DC

## 2017-02-01 NOTE — Progress Notes (Signed)
Subjective:  Presents for complaints of no cycle since her last one in January 12-15. Was regular of pain to let time. Has had a negative pregnancy test outside the office. Has been actively trying to conceive. Has had significant weight gain over the last year. Has an active job but otherwise no regular exercise. According to patient her mother and her sister both have PCOS and her mother is diabetic. Had no trouble conceiving her first son but weighed less than 200 pounds at that time.  Objective:   BP 112/82   Temp 98.1 F (36.7 C)   Ht 5\' 4"  (1.626 m)   Wt 258 lb (117 kg)   BMI 44.29 kg/m  NAD. Alert, oriented. Mild acanthosis nigricans noted around the neck area. Lungs clear. Heart regular rate rhythm. Has gained over 30 pounds in the past year. Results for orders placed or performed in visit on 02/01/17  POCT urine pregnancy  Result Value Ref Range   Preg Test, Ur Negative Negative  POCT glycosylated hemoglobin (Hb A1C)  Result Value Ref Range   Hemoglobin A1C 5.1     Assessment:   Problem List Items Addressed This Visit      Endocrine   PCOS (polycystic ovarian syndrome) - Primary   Relevant Orders   POCT glycosylated hemoglobin (Hb A1C) (Completed)     Other   Amenorrhea   Relevant Orders   POCT urine pregnancy (Completed)       Plan:   Meds ordered this encounter  Medications  . norethindrone-ethinyl estradiol (JUNEL FE,GILDESS FE,LOESTRIN FE) 1-20 MG-MCG tablet    Sig: Take 1 tablet by mouth daily.    Dispense:  1 Package    Refill:  11    Order Specific Question:   Supervising Provider    Answer:   Merlyn Albert [[id number]]  . metFORMIN (GLUCOPHAGE) 500 MG tablet    Sig: Take 1 tablet (500 mg total) by mouth 2 (two) times daily with a meal.    Dispense:  60 tablet    Refill:  2    Order Specific Question:   Supervising Provider    Answer:   Merlyn Albert [[id number]]   Discussed options. Restart birth control pills to help her cycles. Patient understands  that she still may not have a cycle due to the PCOS. Start metformin with supper and if tolerated gradually increase to twice a day with meals. Lengthy discussion regarding the importance of regular exercise and weight loss goal of 30 pounds over the next 6-12 months and a diet low in sugar and simple carbs. Patient understands this will affect her fertility as well as increasing her risk for diabetes. Over 50% of this visit was spent in consultation. Defers referral to fertility specialist at this time. Return in about 6 months (around 08/03/2017) for recheck.

## 2017-02-20 DIAGNOSIS — G43711 Chronic migraine without aura, intractable, with status migrainosus: Secondary | ICD-10-CM | POA: Diagnosis not present

## 2017-02-20 DIAGNOSIS — M545 Low back pain: Secondary | ICD-10-CM | POA: Diagnosis not present

## 2017-02-20 DIAGNOSIS — G5602 Carpal tunnel syndrome, left upper limb: Secondary | ICD-10-CM | POA: Diagnosis not present

## 2017-02-20 DIAGNOSIS — G5601 Carpal tunnel syndrome, right upper limb: Secondary | ICD-10-CM | POA: Diagnosis not present

## 2017-02-20 DIAGNOSIS — R5383 Other fatigue: Secondary | ICD-10-CM | POA: Diagnosis not present

## 2017-02-20 DIAGNOSIS — E6609 Other obesity due to excess calories: Secondary | ICD-10-CM | POA: Diagnosis not present

## 2017-02-20 MED FILL — TROKENDI XR 25 MG CAPSULE: 25 | 30 days supply | Qty: 30 | Fill #0

## 2017-03-03 ENCOUNTER — Encounter (HOSPITAL_COMMUNITY): Payer: Self-pay | Admitting: Emergency Medicine

## 2017-03-03 ENCOUNTER — Emergency Department (HOSPITAL_COMMUNITY)
Admission: EM | Admit: 2017-03-03 | Discharge: 2017-03-03 | Disposition: A | Discharge status: Home / Self Care | Payer: 59 | Attending: Emergency Medicine | Admitting: Emergency Medicine

## 2017-03-03 DIAGNOSIS — G43909 Migraine, unspecified, not intractable, without status migrainosus: Secondary | ICD-10-CM | POA: Diagnosis present

## 2017-03-03 DIAGNOSIS — G43009 Migraine without aura, not intractable, without status migrainosus: Secondary | ICD-10-CM | POA: Diagnosis not present

## 2017-03-03 LAB — I-STAT CHEM 8, ED
BUN: 7 mg/dL (ref 6–20)
CHLORIDE: 102 mmol/L (ref 101–111)
CREATININE: 0.8 mg/dL (ref 0.44–1.00)
Calcium, Ion: 1.17 mmol/L (ref 1.15–1.40)
Glucose, Bld: 108 mg/dL — ABNORMAL HIGH (ref 65–99)
HEMATOCRIT: 40 % (ref 36.0–46.0)
Hemoglobin: 13.6 g/dL (ref 12.0–15.0)
Potassium: 3.7 mmol/L (ref 3.5–5.1)
Sodium: 140 mmol/L (ref 135–145)
TCO2: 24 mmol/L (ref 0–100)

## 2017-03-03 LAB — I-STAT BETA HCG BLOOD, ED (MC, WL, AP ONLY): I-stat hCG, quantitative: <5 m[IU]/mL (ref <5)

## 2017-03-03 MED ORDER — SODIUM CHLORIDE 0.9 % IV BOLUS (SEPSIS)
500.0000 mL | Freq: Once | INTRAVENOUS | Status: AC
  Administered 2017-03-03: 500 mL via INTRAVENOUS

## 2017-03-03 MED ORDER — PREDNISONE 20 MG PO TABS: 40.0000 mg | ORAL_TABLET | Freq: Every day | ORAL | 0 refills | Status: AC

## 2017-03-03 MED ORDER — TRAMADOL HCL 50 MG PO TABS
50.0000 mg | ORAL_TABLET | Freq: Once | ORAL | Status: AC
  Administered 2017-03-03: 50 mg via ORAL
  Filled 2017-03-03: qty 1

## 2017-03-03 MED ORDER — DIPHENHYDRAMINE HCL 50 MG/ML IJ SOLN
50.0000 mg | Freq: Once | INTRAMUSCULAR | Status: AC
  Administered 2017-03-03: 50 mg via INTRAVENOUS
  Filled 2017-03-03: qty 1

## 2017-03-03 MED ORDER — KETOROLAC TROMETHAMINE 30 MG/ML IJ SOLN
30.0000 mg | Freq: Once | INTRAMUSCULAR | Status: AC
  Administered 2017-03-03: 30 mg via INTRAVENOUS
  Filled 2017-03-03: qty 1

## 2017-03-03 MED ORDER — METOCLOPRAMIDE HCL 5 MG/ML IJ SOLN
10.0000 mg | Freq: Once | INTRAMUSCULAR | Status: AC
  Administered 2017-03-03: 10 mg via INTRAVENOUS
  Filled 2017-03-03: qty 2

## 2017-03-03 MED ORDER — METOCLOPRAMIDE HCL 10 MG PO TABS: 10.0000 mg | ORAL_TABLET | Freq: Four times a day (QID) | ORAL | 0 refills | Status: DC

## 2017-03-03 NOTE — ED Triage Notes (Addendum)
Pt reports a headache x 2 weeks- She reports she has called her neurologist but has not heard from him- She appears non photophobic, speaks clearly, and ambulates heel to toe without stagger or drift  Dr Janae Sauce is her neurologist

## 2017-03-03 NOTE — ED Provider Notes (Signed)
Emergency Department Provider Note   I have reviewed the triage vital signs and the nursing notes.   HISTORY  Chief Complaint Migraine   HPI Melissa Lin is a 26 y.o. female with PMH of migraines presents to the emergency department for evaluation of intermittent migraine-like headache for the past 2 weeks with some associated left upper extremity tingling. Patient has had the tingling and some blurred vision with prior headaches. She describes her headache as total head "pressure...like my head is going to pop off." She has been taking Tylenol and Motrin with some intermittent relief. Yesterday evening she was able to tolerate her headache but this morning it returned with some increased severity. No sudden onset, maximal intensity headache. No fevers, chills, neck stiffness. She notes left upper extremity tingling that has been intermittent as well. She's had this with past headaches. She has a neurologist, Dr. Gerilyn Pilgrim, who follows her for HA symptoms. She tried her lidocaine spray at home as well with no relief.   Past Medical History:  Diagnosis Date  . Migraines   . Missed periods 06/24/2015  . Weight gain 06/24/2015    Patient Active Problem List   Diagnosis Date Noted  . PCOS (polycystic ovarian syndrome) 01/31/2016  . Morbid obesity (HCC) 01/01/2016  . Hirsutism 01/01/2016  . Acanthosis nigricans 01/01/2016  . Amenorrhea 01/01/2016  . Missed periods 06/24/2015  . Weight gain 06/24/2015  . Migraine headache without aura 02/18/2015  . LOW BACK PAIN 12/24/2007  . BACK PAIN 12/24/2007    Past Surgical History:  Procedure Laterality Date  . WISDOM TOOTH EXTRACTION      Current Outpatient Rx  . Order #: [id number] Class: Normal  . Order #: [id number] Class: Print  . Order #: [id number] Class: Normal  . Order #: [id number] Class: Print  . Order #: [id number] Class: Normal  . Order #: [id number] Class: Normal    Allergies Patient has no known allergies.  Family History    Problem Relation Age of Onset  . Lupus Mother   . Diabetes Mother   . Hypertension Father   . Other Sister        high testosterone level  . Diabetes Sister        borderline  . Eczema Son   . Diabetes Maternal Grandmother   . Diabetes Paternal Grandmother   . Hypertension Paternal Grandfather   . Other Paternal Grandfather        had open heart surgery    Social History Social History  Substance Use Topics  . Smoking status: Never Smoker  . Smokeless tobacco: Never Used  . Alcohol use Yes     Comment: on birthday's    Review of Systems  Constitutional: No fever/chills Eyes: No visual changes. ENT: No sore throat. Cardiovascular: Denies chest pain. Respiratory: Denies shortness of breath. Gastrointestinal: No abdominal pain.  No nausea, no vomiting.  No diarrhea.  No constipation. Genitourinary: Negative for dysuria. Musculoskeletal: Negative for back pain. Skin: Negative for rash. Neurological: Negative for focal weakness or numbness. Positive HA and LUE tingling.   10-point ROS otherwise negative.  ____________________________________________   PHYSICAL EXAM:  VITAL SIGNS: ED Triage Vitals  Enc Vitals Group     BP 03/03/17 1635 130/74     Pulse Rate 03/03/17 1635 (!) 118     Resp 03/03/17 1635 18     Temp 03/03/17 1635 98.4 F (36.9 C)     Temp Source 03/03/17 1635 Oral     SpO2 03/03/17 1635 100 %  Pain Score 03/03/17 1634 10   Constitutional: Alert and oriented. Well appearing and in no acute distress. Eyes: Conjunctivae are normal. PERRL. Head: Atraumatic. Nose: No congestion/rhinnorhea. Mouth/Throat: Mucous membranes are moist.  Oropharynx non-erythematous. Neck: No stridor.  No meningeal signs.   Cardiovascular: Normal rate, regular rhythm. Good peripheral circulation. Grossly normal heart sounds.   Respiratory: Normal respiratory effort.  No retractions. Lungs CTAB. Gastrointestinal: Soft and nontender. No distention.  Musculoskeletal: No  lower extremity tenderness nor edema. No gross deformities of extremities. Neurologic:  Normal speech and language. No gross focal neurologic deficits are appreciated. Normal CN exam 2-12. Normal gait. No pronator drift.  Skin:  Skin is warm, dry and intact. No rash noted. Psychiatric: Mood and affect are normal. Speech and behavior are normal.  ____________________________________________   LABS (all labs ordered are listed, but only abnormal results are displayed)  Labs Reviewed  I-STAT CHEM 8, ED - Abnormal; Notable for the following:       Result Value   Glucose, Bld 108 (*)    All other components within normal limits  I-STAT BETA HCG BLOOD, ED ([facility], [facility], [facility] ONLY)   ____________________________________________   PROCEDURES  Procedure(s) performed:   Procedures  None ____________________________________________   INITIAL IMPRESSION / ASSESSMENT AND PLAN / ED COURSE  Pertinent labs & imaging results that were available during my care of the patient were reviewed by me and considered in my medical decision making (see chart for details).  Patient resents to the emergency department for evaluation of migraine headache symptoms that occurred intermittently over the past 2 weeks with some associated left upper extremity tingling. No focal neuro deficits on exam. Patient has a steady gait. Patient has had tingling and blurry vision with prior headaches. This headache feels similar to prior headaches it's just been intermittent and lasting longer than it typically does. Plan for IV fluids, Toradol, Reglan, Benadryl and reassess.  06:14 PM Patient states her headache has mostly resolved at this point. She continues to have some mild tingling in left upper extremity and feels slightly lightheaded after medications. Plan for orthostatic vital signs and additional 500 ml of fluid. Suspect tingling is 2/2 complex migraine. No further neuro imaging at this time. Plan for patient to follow  with Dr. Gerilyn Pilgrim as an outpatient.   Spoke with Dr. Gerilyn Pilgrim by phone. Patient will call for f/u appointment on Monday.   At this time, I do not feel there is any life-threatening condition present. I have reviewed and discussed all results (EKG, imaging, lab, urine as appropriate), exam findings with patient. I have reviewed nursing notes and appropriate previous records.  I feel the patient is safe to be discharged home without further emergent workup. Discussed usual and customary return precautions. Patient and family (if present) verbalize understanding and are comfortable with this plan.  Patient will follow-up with their primary care provider. If they do not have a primary care provider, information for follow-up has been provided to them. All questions have been answered.  ____________________________________________  FINAL CLINICAL IMPRESSION(S) / ED DIAGNOSES  Final diagnoses:  Migraine without aura and without status migrainosus, not intractable     MEDICATIONS GIVEN DURING THIS VISIT:  Medications  sodium chloride 0.9 % bolus 500 mL (0 mLs Intravenous Stopped 03/03/17 1824)  ketorolac (TORADOL) 30 MG/ML injection 30 mg (30 mg Intravenous Given 03/03/17 1724)  metoCLOPramide (REGLAN) injection 10 mg (10 mg Intravenous Given 03/03/17 1724)  diphenhydrAMINE (BENADRYL) injection 50 mg (50 mg Intravenous Given 03/03/17  1724)  sodium chloride 0.9 % bolus 500 mL (0 mLs Intravenous Stopped 03/03/17 1915)  traMADol (ULTRAM) tablet 50 mg (50 mg Oral Given 03/03/17 1944)     NEW OUTPATIENT MEDICATIONS STARTED DURING THIS VISIT:  Discharge Medication List as of 03/03/2017  7:25 PM    START taking these medications   Details  metoCLOPramide (REGLAN) 10 MG tablet Take 1 tablet (10 mg total) by mouth every 6 (six) hours., Starting Sat 03/03/2017, Print    predniSONE (DELTASONE) 20 MG tablet Take 2 tablets (40 mg total) by mouth daily., Starting Sat 03/03/2017, Until Thu 03/08/2017, Print         Note:  This document was prepared using Dragon voice recognition software and may include unintentional dictation errors.  Alona Bene, MD Emergency Medicine   Tamsen Reist, Arlyss Repress, MD 03/04/17 [phone]

## 2017-03-03 NOTE — ED Triage Notes (Addendum)
Pt c/o migraine for "days" now. Has tried tylenol/motrin with no relief. Left arm tingling and c/o blurred vision. Nad. Denies v/d. Intermittent nausea.

## 2017-03-03 NOTE — Discharge Instructions (Signed)
You have been seen in the Emergency Department (ED) for a migraine.  Please use Tylenol or Motrin as needed for symptoms, but only as written on the box, and take any regular medications that have been prescribed for you. °As we have discussed, please follow up with your doctor as soon as possible regarding today’s ED visit and your headache symptoms.   ° °Call your doctor or return to the Emergency Department (ED) if you have a worsening headache, sudden and severe headache, confusion, slurred speech, facial droop, weakness or numbness in any arm or leg, extreme fatigue, or other symptoms that concern you. ° ° °Migraine Headache °A migraine headache is an intense, throbbing pain on one or both sides of your head. A migraine can last for 30 minutes to several hours. °CAUSES  °The exact cause of a migraine headache is not always known. However, a migraine may be caused when nerves in the brain become irritated and release chemicals that cause inflammation. This causes pain. °Certain things may also trigger migraines, such as: °· Alcohol. °· Smoking. °· Stress. °· Menstruation. °· Aged cheeses. °· Foods or drinks that contain nitrates, glutamate, aspartame, or tyramine. °· Lack of sleep. °· Chocolate. °· Caffeine. °· Hunger. °· Physical exertion. °· Fatigue. °· Medicines used to treat chest pain (nitroglycerine), birth control pills, estrogen, and some blood pressure medicines. °SIGNS AND SYMPTOMS °· Pain on one or both sides of your head. °· Pulsating or throbbing pain. °· Severe pain that prevents daily activities. °· Pain that is aggravated by any physical activity. °· Nausea, vomiting, or both. °· Dizziness. °· Pain with exposure to bright lights, loud noises, or activity. °· General sensitivity to bright lights, loud noises, or smells. °Before you get a migraine, you may get warning signs that a migraine is coming (aura). An aura may include: °· Seeing flashing lights. °· Seeing bright spots, halos, or zigzag  lines. °· Having tunnel vision or blurred vision. °· Having feelings of numbness or tingling. °· Having trouble talking. °· Having muscle weakness. °DIAGNOSIS  °A migraine headache is often diagnosed based on: °· Symptoms. °· Physical exam. °· A CT scan or MRI of your head. These imaging tests cannot diagnose migraines, but they can help rule out other causes of headaches. °TREATMENT °Medicines may be given for pain and nausea. Medicines can also be given to help prevent recurrent migraines.  °HOME CARE INSTRUCTIONS °· Only take over-the-counter or prescription medicines for pain or discomfort as directed by your health care provider. The use of Kristell Wooding-term narcotics is not recommended. °· Lie down in a dark, quiet room when you have a migraine. °· Keep a journal to find out what may trigger your migraine headaches. For example, write down: °¨ What you eat and drink. °¨ How much sleep you get. °¨ Any change to your diet or medicines. °· Limit alcohol consumption. °· Quit smoking if you smoke. °· Get 7-9 hours of sleep, or as recommended by your health care provider. °· Limit stress. °· Keep lights dim if bright lights bother you and make your migraines worse. °SEEK IMMEDIATE MEDICAL CARE IF:  °· Your migraine becomes severe. °· You have a fever. °· You have a stiff neck. °· You have vision loss. °· You have muscular weakness or loss of muscle control. °· You start losing your balance or have trouble walking. °· You feel faint or pass out. °· You have severe symptoms that are different from your first symptoms. °MAKE SURE YOU:  °·   Understand these instructions. °· Will watch your condition. °· Will get help right away if you are not doing well or get worse. °  °This information is not intended to replace advice given to you by your health care provider. Make sure you discuss any questions you have with your health care provider. °  °Document Released: 10/02/2005 Document Revised: 10/23/2014 Document Reviewed:  06/09/2013 °Elsevier Interactive Patient Education ©2016 [employer] ° ° ° °

## 2017-03-03 NOTE — ED Notes (Signed)
AVS and prescriptions for discharge reviewed with pt giving verbalized understandings.  Pt left with friend.

## 2017-03-05 ENCOUNTER — Encounter: Payer: Self-pay | Admitting: Family Medicine

## 2017-03-05 ENCOUNTER — Ambulatory Visit (INDEPENDENT_AMBULATORY_CARE_PROVIDER_SITE_OTHER): Payer: 59 | Admitting: Family Medicine

## 2017-03-05 VITALS — BP 124/82 | Temp 98.3°F | Wt 254.4 lb

## 2017-03-05 DIAGNOSIS — G43811 Other migraine, intractable, with status migrainosus: Secondary | ICD-10-CM | POA: Diagnosis not present

## 2017-03-05 MED ORDER — ONDANSETRON 4 MG PO TBDP: ORAL_TABLET | ORAL | 0 refills | Status: DC

## 2017-03-05 MED ORDER — HYDROCODONE-ACETAMINOPHEN 5-325 MG PO TABS: 1.0000 | ORAL_TABLET | Freq: Four times a day (QID) | ORAL | 0 refills | Status: DC | PRN

## 2017-03-05 NOTE — Progress Notes (Signed)
   Subjective:    Patient ID: Melissa Lin, female    DOB: Sep 23, 1991, 26 y.o.   MRN: [id number] Patient arrives office for a very protracted discussion regarding her migraines. Migraine   This is a new problem. The current episode started 1 to 4 weeks ago. Associated symptoms include blurred vision.   Patient in today for ER follow up for Migraines. Has c/o blurred vision for the past week Complete emergency room record reviewed in presence of patient. Next  Long-standing history of migraine headaches.  Has tried Topamax in the past without help. Has tried Maxalt without help. Was given lidocaine nasal spray states it really does not do much for her  Patient's neurologist recently started her on a beta blocker which she feels is "just throwing medications at the problem" next  Notes tingling in left arm and hand off-and-on for the past couple weeks. Since of clumsiness at times. Also blurred vision. Next  Headaches coming go severe at times. Positive throbbing component positive nausea.  Has missed a couple days of work.  .   Review of Systems  Eyes: Positive for blurred vision.  No headache, no major weight loss or weight gain, no chest pain no back pain abdominal pain no change in bowel habits complete ROS otherwise negative      Objective:   Physical Exam  Alert and oriented, vitals reviewed and stable, NAD ENT-TM's and ext canals WNL bilat via otoscopic exam Soft palate, tonsils and post pharynx WNL via oropharyngeal exam Neck-symmetric, no masses; thyroid nonpalpable and nontender Pulmonary-no tachypnea or accessory muscle use; Clear without wheezes via auscultation Card--no abnrml murmurs, rhythm reg and rate WNL Carotid pulses symmetric, without bruits Alert no acute distress left arm strength intact sensation overall intact currently reflexes intact      Assessment & Plan:  Impression complex migraine headaches now with left arm numbness and tingling at times  along with visual symptomatology. Based on this patient strongly advised not to use Maxalt in the future. Also long discussion held about her migraine management. Does not feel her current specialist is really looking out for her needs. Patient to go ahead and take short course of steroids. Hold off on Reglan. Add hydrocodone for short-term pain. We will work on a neurology referral.  Greater than 50% of this 25 minute face to face visit was spent in counseling and discussion and coordination of care regarding the above diagnosis/diagnosies

## 2017-03-06 ENCOUNTER — Encounter: Payer: Self-pay | Admitting: Family Medicine

## 2017-03-06 ENCOUNTER — Other Ambulatory Visit (HOSPITAL_COMMUNITY): Payer: Self-pay | Admitting: Neurology

## 2017-03-06 ENCOUNTER — Encounter: Payer: Self-pay | Admitting: Neurology

## 2017-03-06 DIAGNOSIS — R200 Anesthesia of skin: Secondary | ICD-10-CM

## 2017-03-13 ENCOUNTER — Ambulatory Visit (HOSPITAL_COMMUNITY)
Admission: RE | Admit: 2017-03-13 | Discharge: 2017-03-13 | Disposition: A | Discharge status: Home / Self Care | Payer: 59 | Source: Ambulatory Visit | Attending: Neurology | Admitting: Neurology

## 2017-03-13 DIAGNOSIS — G35 Multiple sclerosis: Secondary | ICD-10-CM | POA: Insufficient documentation

## 2017-03-13 DIAGNOSIS — R200 Anesthesia of skin: Secondary | ICD-10-CM | POA: Insufficient documentation

## 2017-03-13 DIAGNOSIS — R51 Headache: Secondary | ICD-10-CM | POA: Diagnosis not present

## 2017-03-13 DIAGNOSIS — R9089 Other abnormal findings on diagnostic imaging of central nervous system: Secondary | ICD-10-CM | POA: Insufficient documentation

## 2017-05-01 ENCOUNTER — Telehealth: Payer: Self-pay | Admitting: Family Medicine

## 2017-05-01 NOTE — Telephone Encounter (Signed)
Requesting shot record.

## 2017-05-01 NOTE — Telephone Encounter (Signed)
No shot record in EPIC or [address]

## 2017-05-29 ENCOUNTER — Ambulatory Visit: Payer: 59 | Admitting: Neurology

## 2017-08-03 ENCOUNTER — Encounter: Payer: Self-pay | Admitting: Family Medicine

## 2017-08-03 ENCOUNTER — Ambulatory Visit (INDEPENDENT_AMBULATORY_CARE_PROVIDER_SITE_OTHER): Payer: Commercial Managed Care - PPO | Admitting: Nurse Practitioner

## 2017-08-03 ENCOUNTER — Encounter: Payer: Self-pay | Admitting: Nurse Practitioner

## 2017-08-03 VITALS — BP 128/80 | Ht 64.0 in | Wt 245.0 lb

## 2017-08-03 DIAGNOSIS — E282 Polycystic ovarian syndrome: Secondary | ICD-10-CM

## 2017-08-03 LAB — POCT GLYCOSYLATED HEMOGLOBIN (HGB A1C): Hemoglobin A1C: 4.7

## 2017-08-03 MED ORDER — METFORMIN HCL 500 MG PO TABS: 500.0000 mg | ORAL_TABLET | Freq: Two times a day (BID) | ORAL | 2 refills | Status: DC

## 2017-08-03 NOTE — Progress Notes (Signed)
Subjective: Presents for recheck on her PCOS.  Stopped her birth control pills several months ago.  Has had for regular cycles with normal flow that started back after birth control use.  Continues to plan for another pregnancy.  Takes her metformin but admits that she has trouble remembering it sometimes.  Objective:   BP 128/80   Ht 5\' 4"  (1.626 m)   Wt 245 lb (111.1 kg)   BMI 42.05 kg/m  NAD.  Alert, oriented.  Lungs clear.  Heart regular rate and rhythm.  Very faint acanthosis nigricans noted on the posterior neck area.  Has lost 13 pounds since her last checkup in April. Results for orders placed or performed in visit on 08/03/17  POCT glycosylated hemoglobin (Hb A1C)  Result Value Ref Range   Hemoglobin A1C 4.7     Assessment:   Problem List Items Addressed This Visit      Endocrine   PCOS (polycystic ovarian syndrome) - Primary   Relevant Orders   POCT glycosylated hemoglobin (Hb A1C) (Completed)       Plan:   Meds ordered this encounter  Medications  . metFORMIN (GLUCOPHAGE) 500 MG tablet    Sig: Take 1 tablet (500 mg total) by mouth 2 (two) times daily with a meal.    Dispense:  60 tablet    Refill:  2    Order Specific Question:   Supervising Provider    Answer:   Merlyn AlbertLUKING, WILLIAM S [[id number]   Continue metformin as directed.  Lengthy discussion regarding diagnosis of PCOS and risk for diabetes.  Again discussed the importance of regular exercise and continued weight loss. Return in about 6 months (around 02/01/2018) for recheck.

## 2017-10-18 ENCOUNTER — Encounter: Payer: Self-pay | Admitting: Family Medicine

## 2018-02-01 ENCOUNTER — Ambulatory Visit: Payer: Commercial Managed Care - PPO | Admitting: Nurse Practitioner

## 2018-05-13 IMAGING — DX DG KNEE COMPLETE 4+V*L*
4 series · 4 of 4 positions shown · non-contrast
Comparison: None available

CLINICAL DATA: Acute left knee pain and swelling, popping sensation

EXAM:
LEFT KNEE - COMPLETE 4+ VIEW

[knee ap]
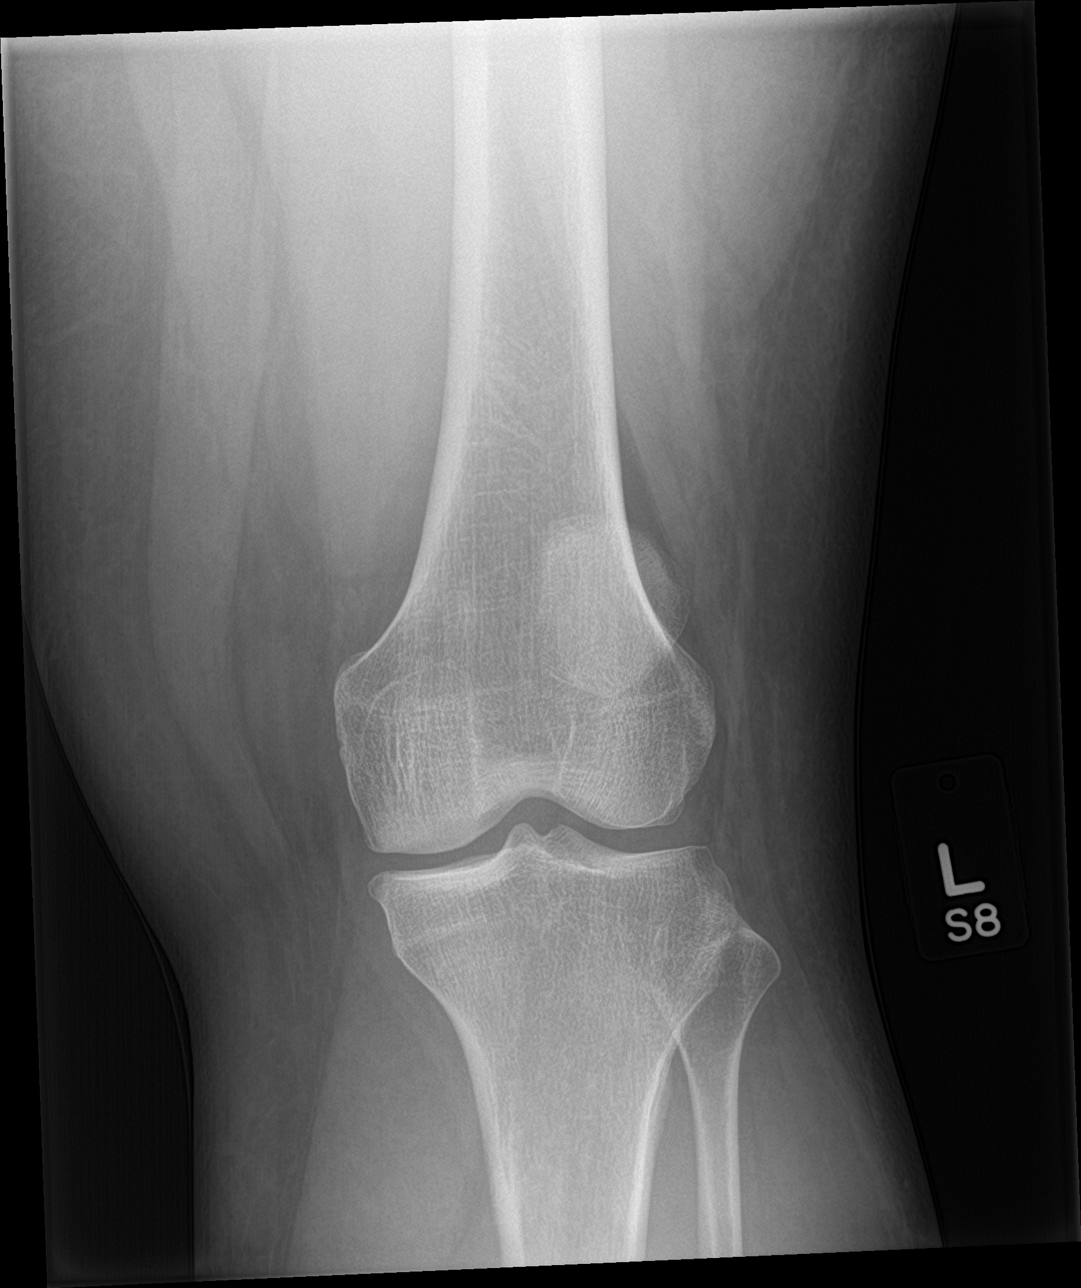

[tunnel]
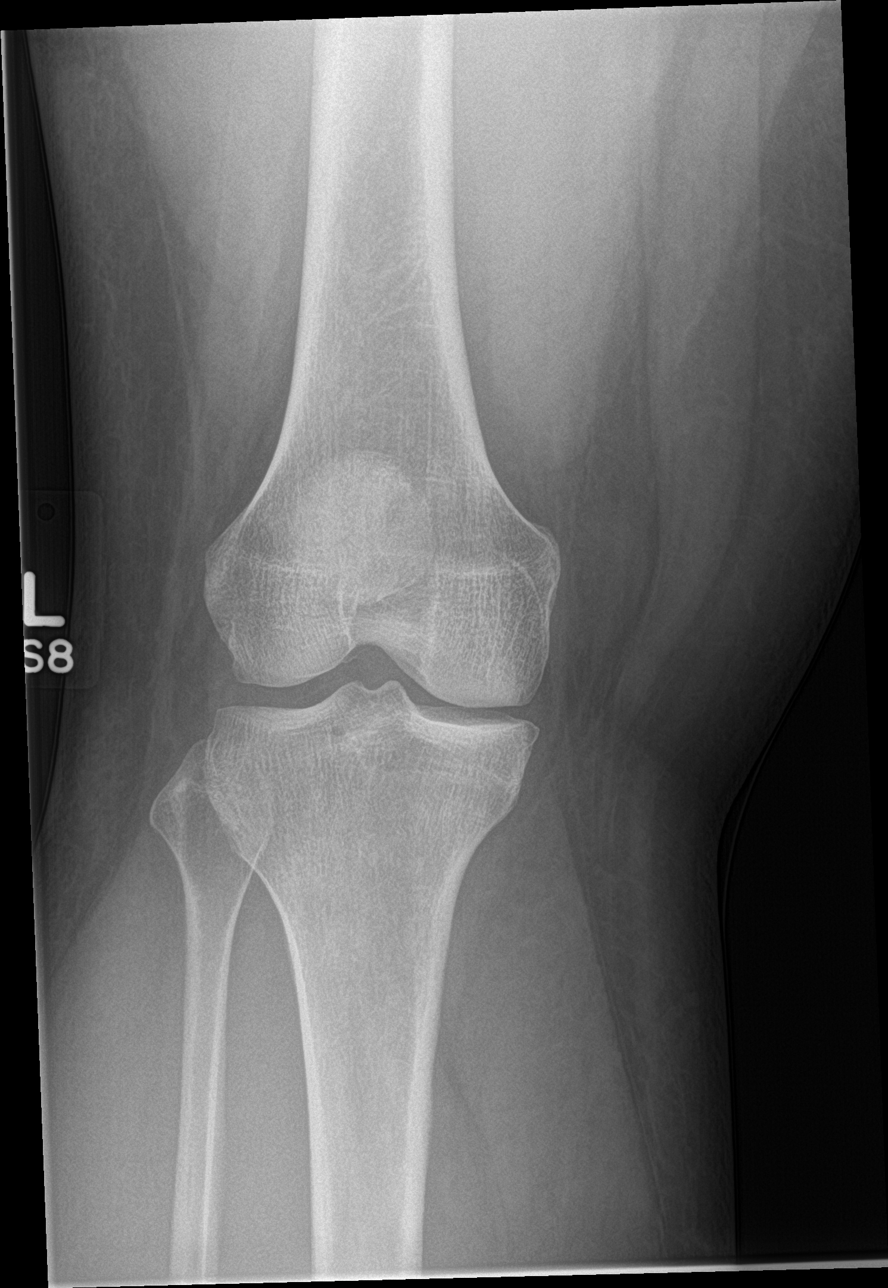

[knee lat]
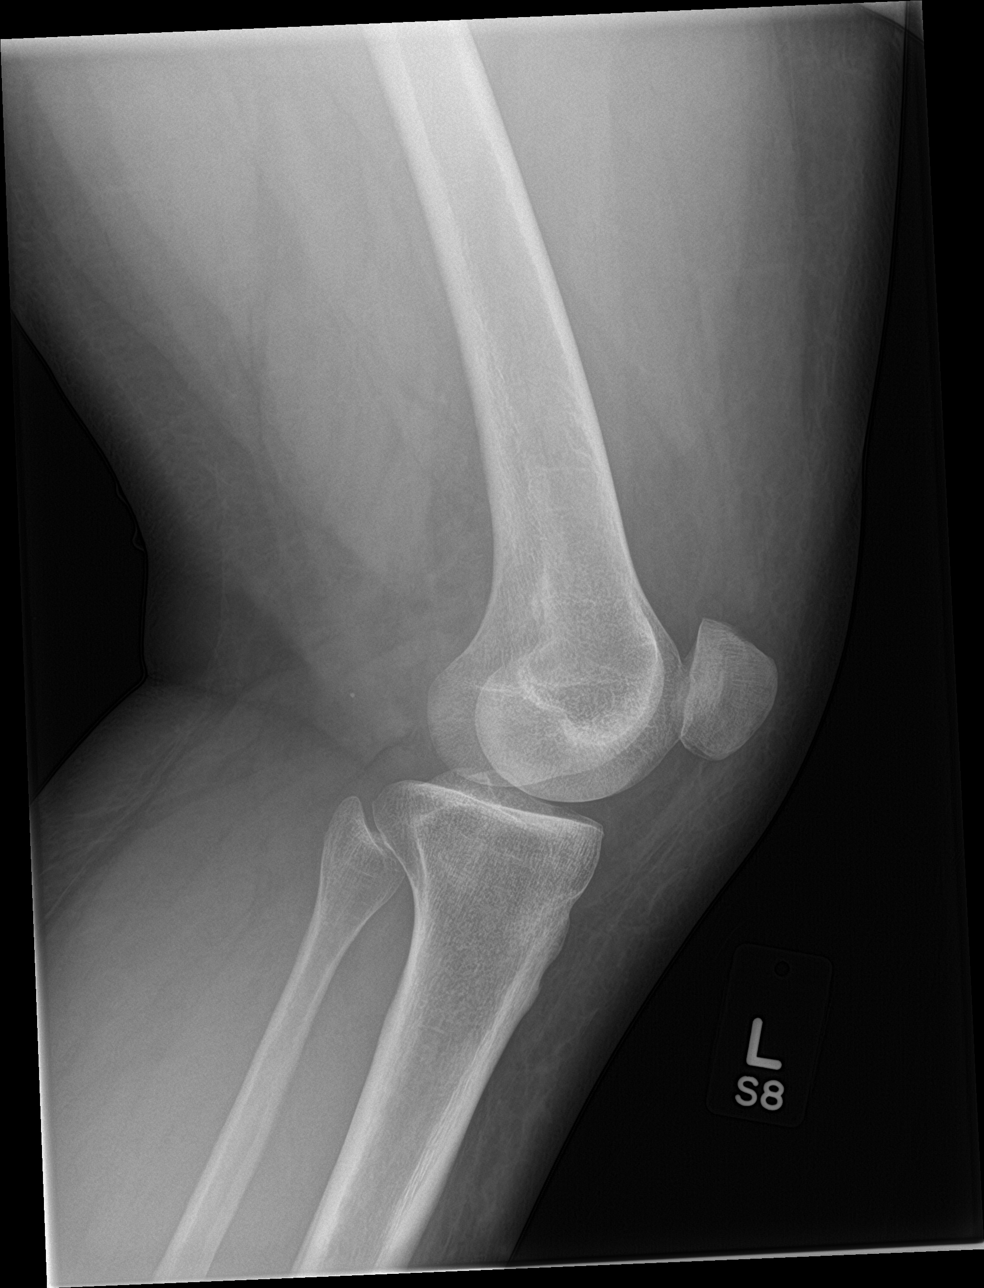

[knee sunrise]
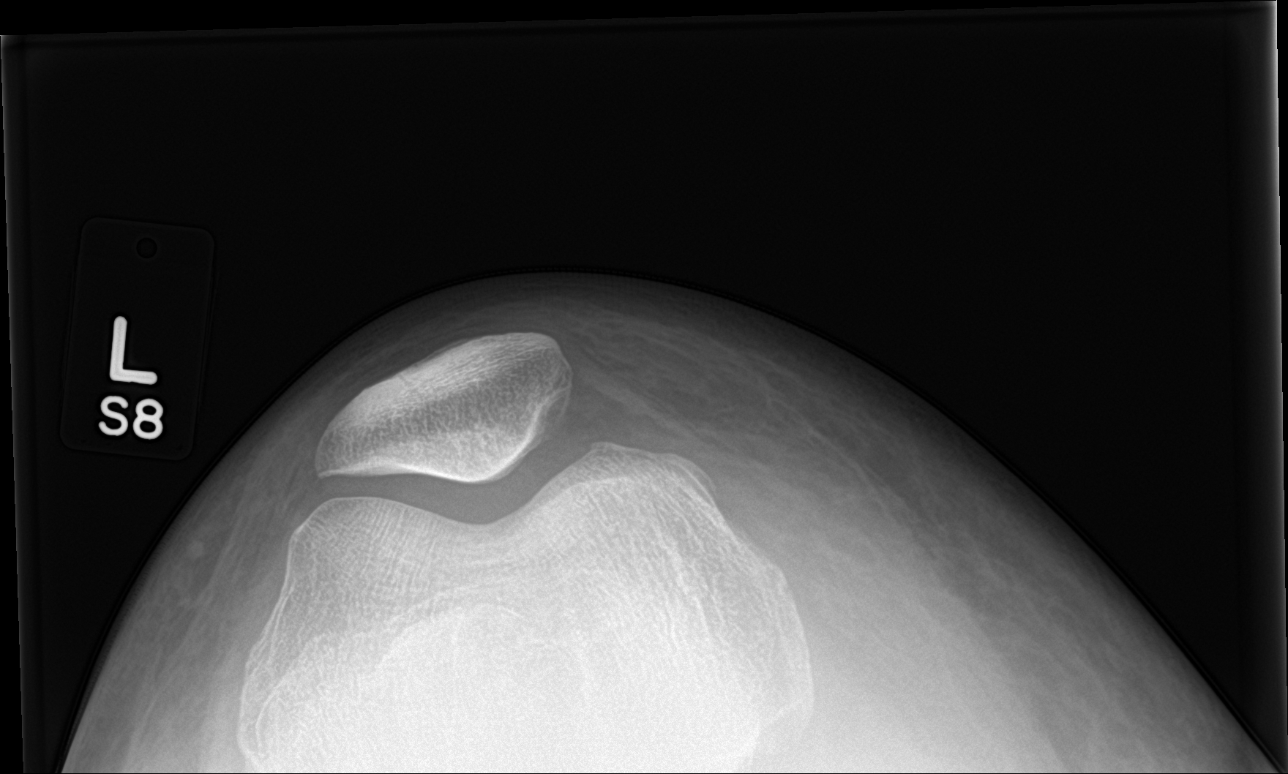

[4 of 4 positions shown; findings below may reference images not displayed]

FINDINGS: Obese body habitus. Normal alignment without acute fracture,
subluxation, dislocation, or significant effusion. Patella is
located. No significant joint abnormality.
IMPRESSION: No acute osseous finding.

## 2019-11-17 ENCOUNTER — Encounter: Payer: Self-pay | Admitting: Family Medicine

## 2024-04-19 ENCOUNTER — Other Ambulatory Visit: Payer: Self-pay

## 2024-04-19 ENCOUNTER — Encounter (HOSPITAL_COMMUNITY): Payer: Self-pay | Admitting: Emergency Medicine

## 2024-04-19 ENCOUNTER — Emergency Department (HOSPITAL_COMMUNITY)
Admission: EM | Admit: 2024-04-19 | Discharge: 2024-04-20 | Disposition: A | Discharge status: Home / Self Care | Attending: Emergency Medicine | Admitting: Emergency Medicine

## 2024-04-19 DIAGNOSIS — E282 Polycystic ovarian syndrome: Secondary | ICD-10-CM | POA: Diagnosis not present

## 2024-04-19 DIAGNOSIS — O200 Threatened abortion: Secondary | ICD-10-CM | POA: Insufficient documentation

## 2024-04-19 DIAGNOSIS — Z3A00 Weeks of gestation of pregnancy not specified: Secondary | ICD-10-CM | POA: Diagnosis not present

## 2024-04-19 DIAGNOSIS — O209 Hemorrhage in early pregnancy, unspecified: Secondary | ICD-10-CM | POA: Diagnosis present

## 2024-04-19 DIAGNOSIS — N939 Abnormal uterine and vaginal bleeding, unspecified: Secondary | ICD-10-CM | POA: Diagnosis not present

## 2024-04-19 DIAGNOSIS — O039 Complete or unspecified spontaneous abortion without complication: Secondary | ICD-10-CM | POA: Diagnosis not present

## 2024-04-19 DIAGNOSIS — O4690 Antepartum hemorrhage, unspecified, unspecified trimester: Secondary | ICD-10-CM

## 2024-04-19 DIAGNOSIS — Z5329 Procedure and treatment not carried out because of patient's decision for other reasons: Secondary | ICD-10-CM | POA: Insufficient documentation

## 2024-04-19 LAB — BASIC METABOLIC PANEL WITH GFR
Anion gap: 10 (ref 5–15)
BUN: 12 mg/dL (ref 6–20)
CO2: 22 mmol/L (ref 22–32)
Calcium: 9.2 mg/dL (ref 8.9–10.3)
Chloride: 102 mmol/L (ref 98–111)
Creatinine, Ser: 0.78 mg/dL (ref 0.44–1.00)
GFR, Estimated: >60 mL/min (ref >60)
Glucose, Bld: 105 mg/dL — ABNORMAL HIGH (ref 70–99)
Potassium: 3.4 mmol/L — ABNORMAL LOW (ref 3.5–5.1)
Sodium: 134 mmol/L — ABNORMAL LOW (ref 135–145)

## 2024-04-19 LAB — CBC
HCT: 34.6 % — ABNORMAL LOW (ref 36.0–46.0)
Hemoglobin: 11.1 g/dL — ABNORMAL LOW (ref 12.0–15.0)
MCH: 29 pg (ref 26.0–34.0)
MCHC: 32.1 g/dL (ref 30.0–36.0)
MCV: 90.3 fL (ref 80.0–100.0)
Platelets: 305 K/uL (ref 150–400)
RBC: 3.83 MIL/uL — ABNORMAL LOW (ref 3.87–5.11)
RDW: 14.2 % (ref 11.5–15.5)
WBC: 11 K/uL — ABNORMAL HIGH (ref 4.0–10.5)
nRBC: 0 % (ref 0.0–0.2)

## 2024-04-19 LAB — HCG, QUANTITATIVE, PREGNANCY: hCG, Beta Chain, Quant, S: 11799 m[IU]/mL — ABNORMAL HIGH (ref <5)

## 2024-04-19 LAB — POC URINE PREG, ED: Preg Test, Ur: POSITIVE — AB

## 2024-04-19 NOTE — Discharge Instructions (Addendum)
 Thank you for coming to [facility] Emergency Department.  You were seen for vaginal bleeding in pregnancy.  We have recommended that you go to the maternity assessment unit in [place] by transfer for an emergent ultrasound but you have elected to leave AGAINST MEDICAL ADVICE.  You understand that you face worsening of your condition up to and including death as a result of leaving today.  Please go immediately to the Maternity Assessment Unit in [place] at [facility] as soon as possible for an ultrasound.

## 2024-04-19 NOTE — ED Triage Notes (Signed)
 Patient coming to ED for evaluation of vaginal bleeding.  Reports I think I might be having a miscarriage.  Has been having cramping and spotting.  Was told that was normal.  Has first OB appointment scheduled for Thursday.  LMP was April 13th.  Has had multiple possible pregnancy test at home.  This is patient's 4th pregnancy.  Has a miscarriage last year.  Today passed a large blood clot.

## 2024-04-19 NOTE — ED Provider Notes (Signed)
 [place] EMERGENCY DEPARTMENT AT [facility] Provider Note   CSN: [id number] Arrival date & time: 04/19/24  2007     History  Chief Complaint  Patient presents with   Vaginal Bleeding    Melissa Lin is a 33 y.o. female with PMH as listed below who presents with ED for evaluation of vaginal bleeding. Reports I think I might be having a miscarriage. Has been having cramping and spotting. Has first OB appointment scheduled for Thursday. LMP was April 13th. Has had multiple positive pregnancy test at home. This is patient's 4th pregnancy. Has a miscarriage last year. Today passed a large blood clot. .    Past Medical History:  Diagnosis Date   Migraines    Missed periods 06/24/2015   Weight gain 06/24/2015       Home Medications Prior to Admission medications   Medication Sig Start Date End Date Taking? Authorizing Provider  HYDROcodone -acetaminophen  (NORCO/VICODIN) 5-325 MG tablet Take 1 tablet by mouth every 6 (six) hours as needed for moderate pain. 03/05/17   Alphonsa Elsie RAMAN, MD  metFORMIN  (GLUCOPHAGE ) 500 MG tablet Take 1 tablet (500 mg total) by mouth 2 (two) times daily with a meal. 08/03/17   Mauro Elveria BROCKS, NP  ondansetron  (ZOFRAN  ODT) 4 MG disintegrating tablet Take 1 tablet by mouth every 6 hours as needed for nausea. 03/05/17   Alphonsa Elsie RAMAN, MD  tiZANidine  (ZANAFLEX ) 4 MG tablet 1 q6 hours prn headache for home use only 11/03/16   Alphonsa Elsie RAMAN, MD      Allergies    Patient has no known allergies.    Review of Systems   Review of Systems A 10 point review of systems was performed and is negative unless otherwise reported in HPI.  Physical Exam Updated Vital Signs BP 112/63 (BP Location: Right Arm)   Pulse 94   Temp 98.4 F (36.9 C) (Oral)   Resp 18   Ht 5' 4 (1.626 m)   Wt 115.7 kg   LMP 01/27/2024   SpO2 99%   BMI 43.77 kg/m  Physical Exam General: Normal appearing female, lying in bed.  HEENT: Sclera anicteric, MMM, trachea  midline.  Cardiology: RRR  Resp: Normal respiratory rate and effort. Abd: Soft, non-tender, non-distended. No rebound tenderness or guarding.  Pelvic: Performed under chaperone.  Normal-appearing external genitalia.  Mild amount of blood in the vaginal vault.  Closed cervix. MSK: No peripheral edema or signs of trauma. Skin: warm, dry.  Neuro: A&Ox4, CNs II-XII grossly intact. MAEs. Sensation grossly intact.  Psych: Normal mood and affect.   ED Results / Procedures / Treatments   Labs (all labs ordered are listed, but only abnormal results are displayed) Labs Reviewed  CBC - Abnormal; Notable for the following components:      Result Value   WBC 11.0 (*)    RBC 3.83 (*)    Hemoglobin 11.1 (*)    HCT 34.6 (*)    All other components within normal limits  HCG, QUANTITATIVE, PREGNANCY - Abnormal; Notable for the following components:   hCG, Beta Chain, Quant, S 11,799 (*)    All other components within normal limits  BASIC METABOLIC PANEL WITH GFR - Abnormal; Notable for the following components:   Sodium 134 (*)    Potassium 3.4 (*)    Glucose, Bld 105 (*)    All other components within normal limits  POC URINE PREG, ED - Abnormal; Notable for the following components:   Preg Test,  Ur Positive (*)    All other components within normal limits    EKG None  Radiology No results found.  Procedures Ultrasound ED OB Pelvic  Date/Time: 04/19/2024 11:48 PM  Performed by: Franklyn Sid SAILOR, MD Authorized by: Franklyn Sid SAILOR, MD   Procedure details:    Indications: evaluate for IUP, suspected ectopic pregnancy, pregnant with abdominal pain, pregnant with pelvic pain and pregnant with vaginal bleeding     Technique:  Transabdominal obstetric (HCG+) exam   Images: not archived   Study Limitations: body habitus and bowel gas Uterine findings:    Intrauterine pregnancy: not identified     Single gestation: not identified     Fetal pole: not identified     Fetal heart rate: not  identified      Left ovary findings:    Left ovary:  Not visualized    Right ovary findings:     Right ovary:  Not visualized    Other findings:    Free pelvic fluid: not identified     Free peritoneal fluid: not identified       Medications Ordered in ED Medications - No data to display  ED Course/ Medical Decision Making/ A&P                          Medical Decision Making Amount and/or Complexity of Data Reviewed Labs: ordered. Decision-making details documented in ED Course.    This patient presents to the ED for concern of pregnant vaginal bleeding, this involves an extensive number of treatment options, and is a complaint that carries with it a high risk of complications and morbidity.  I considered the following differential and admission for this acute, potentially life threatening condition.   MDM:    For vaginal bleeding in pregnancy <20 wks, consider:  Complete vs incomplete vs inevitable vs missed vs threatened abortion.  Lower concern for septic abortion with no fevers or chills, no purulent drainage in the cervix.  Her cervix is closed.  I did a bedside ultrasound which did not demonstrates distinct intrauterine pregnancy, though there do appear to be products of conception in the uterus.  Also cannot rule out a molar pregnancy.  Will consult with OB.  Patient's blood type is B negative per chart review.  Patient is very early pregnancy and data for RhoGAM in this group is mixed.    Clinical Course as of 04/19/24 2348  Sat Apr 19, 2024  2144 HCG, Dorthea Denis Earleen GORMAN(!): 88,200 [HN]  2341 D/w Dr. Ozan who recommends transfer to MAU for official ultrasound.  [HN]  2347 Patient states that she has kids at home left with her fianc and will need to leave AGAINST MEDICAL ADVICE.  I instructed the patient that she could face worsening of her condition up to and including death as result of leaving and patient reports understanding.  She states she will go to the MAU  first thing in the morning.  I advised Dr. Ozan.  Patient will be leaving AMA. [HN]    Clinical Course User Index [HN] Franklyn Sid SAILOR, MD    Labs: I Ordered, and personally interpreted labs.  The pertinent results include:  those listed above  Additional history obtained from chart review.    Reevaluation: After the interventions noted above, I reevaluated the patient and found that they have :stayed the same  Social Determinants of Health:  lives independently  Disposition: AMA  Co morbidities that  complicate the patient evaluation  Past Medical History:  Diagnosis Date   Migraines    Missed periods 06/24/2015   Weight gain 06/24/2015     Medicines No orders of the defined types were placed in this encounter.   I have reviewed the patients home medicines and have made adjustments as needed  Problem List / ED Course: Problem List Items Addressed This Visit   None Visit Diagnoses       Vaginal bleeding in pregnancy    -  Primary     Threatened miscarriage                       This note was created using dictation software, which may contain spelling or grammatical errors.    Franklyn Sid SAILOR, MD 04/19/24 [phone]

## 2024-04-20 ENCOUNTER — Other Ambulatory Visit: Payer: Self-pay

## 2024-04-20 ENCOUNTER — Inpatient Hospital Stay (HOSPITAL_COMMUNITY)

## 2024-04-20 ENCOUNTER — Inpatient Hospital Stay (HOSPITAL_COMMUNITY)
Admission: AD | Admit: 2024-04-20 | Discharge: 2024-04-20 | Disposition: A | Discharge status: Home / Self Care | Attending: Obstetrics & Gynecology | Admitting: Obstetrics & Gynecology

## 2024-04-20 ENCOUNTER — Encounter (HOSPITAL_COMMUNITY): Payer: Self-pay

## 2024-04-20 DIAGNOSIS — O039 Complete or unspecified spontaneous abortion without complication: Secondary | ICD-10-CM | POA: Insufficient documentation

## 2024-04-20 DIAGNOSIS — E282 Polycystic ovarian syndrome: Secondary | ICD-10-CM | POA: Insufficient documentation

## 2024-04-20 DIAGNOSIS — N939 Abnormal uterine and vaginal bleeding, unspecified: Secondary | ICD-10-CM

## 2024-04-20 DIAGNOSIS — B9689 Other specified bacterial agents as the cause of diseases classified elsewhere: Secondary | ICD-10-CM

## 2024-04-20 LAB — URINALYSIS, ROUTINE W REFLEX MICROSCOPIC
Bilirubin Urine: NEGATIVE
Glucose, UA: NEGATIVE mg/dL
Ketones, ur: NEGATIVE mg/dL
Nitrite: NEGATIVE
Protein, ur: 30 mg/dL — AB
RBC / HPF: >50 RBC/hpf (ref 0–5)
Specific Gravity, Urine: 1.028 (ref 1.005–1.030)
pH: 5 (ref 5.0–8.0)

## 2024-04-20 LAB — WET PREP, GENITAL
Sperm: NONE SEEN
Trich, Wet Prep: NONE SEEN
WBC, Wet Prep HPF POC: <10 (ref <10)
Yeast Wet Prep HPF POC: NONE SEEN

## 2024-04-20 MED ORDER — ONDANSETRON 4 MG PO TBDP: 4.0000 mg | ORAL_TABLET | Freq: Three times a day (TID) | ORAL | 0 refills | Status: DC | PRN

## 2024-04-20 MED ORDER — METRONIDAZOLE 500 MG PO TABS: 500.0000 mg | ORAL_TABLET | Freq: Two times a day (BID) | ORAL | 0 refills | Status: AC

## 2024-04-20 MED ORDER — OXYCODONE HCL 5 MG PO TABS: 5.0000 mg | ORAL_TABLET | ORAL | 0 refills | Status: DC | PRN

## 2024-04-20 MED ORDER — IBUPROFEN 600 MG PO TABS: 600.0000 mg | ORAL_TABLET | Freq: Four times a day (QID) | ORAL | 3 refills | Status: DC | PRN

## 2024-04-20 MED ORDER — METRONIDAZOLE 500 MG PO TABS: 500.0000 mg | ORAL_TABLET | Freq: Two times a day (BID) | ORAL | 0 refills | Status: DC

## 2024-04-20 MED ORDER — MISOPROSTOL 200 MCG PO TABS: ORAL_TABLET | ORAL | 1 refills | Status: DC

## 2024-04-20 NOTE — MAU Provider Note (Addendum)
 Chief Complaint:  Abdominal Pain   HPI   None     Melissa Lin is a 33 y.o. G2P1 at Unknown who presents to maternity admissions reporting vaginal bleeding. She had multiple positive home pregnancy tests, LMP approximately 01/27/2024. She has been experiencing mild abdominal cramping and spotting for a few weeks, but has not needed to take any pain medication. She was seen in the ED yesterday for evaluation after passing a large blood clot and experiencing moderate bright red bleeding after that. She had to leave due to childcare and is in the MAU today to complete workup for vaginal bleeding. Past medical history significant for PCOS, irregular periods, BMI >40.  Pregnancy Course: Initial OB scheduled for 04/24/2024 with [facility]. ED evaluation yesterday showed Hgb 11.1, hCG 11,799, bedside US  did not identify IUGS.   Past Medical History:  Diagnosis Date   Migraines    Missed periods 06/24/2015   Weight gain 06/24/2015   OB History  Gravida Para Term Preterm AB Living  2 1    1   SAB IAB Ectopic Multiple Live Births          # Outcome Date GA Lbr Len/2nd Weight Sex Type Anes PTL Lv  2 Current           1 Para            Past Surgical History:  Procedure Laterality Date   WISDOM TOOTH EXTRACTION     Family History  Problem Relation Age of Onset   Lupus Mother    Diabetes Mother    Hypertension Father    Other Sister        high testosterone  level   Diabetes Sister        borderline   Eczema Son    Diabetes Maternal Grandmother    Diabetes Paternal Grandmother    Hypertension Paternal Grandfather    Other Paternal Grandfather        had open heart surgery   Social History   Tobacco Use   Smoking status: Never   Smokeless tobacco: Never  Substance Use Topics   Alcohol use: Yes    Comment: on birthday's   Drug use: No   No Known Allergies No medications prior to admission.    I have reviewed patient's Past Medical Hx, Surgical Hx, Family Hx, Social Hx,  medications and allergies.   ROS  Pertinent items noted in HPI and remainder of comprehensive ROS otherwise negative.   PHYSICAL EXAM  Patient Vitals for the past 24 hrs:  BP Temp Temp src Pulse Resp SpO2  04/20/24 0954 (!) 133/93 98.2 F (36.8 C) Oral 93 16 100 %    Constitutional: Well-developed, well-nourished female in no acute distress.  HEENT: atraumatic, normocephalic. Neck has normal ROM. EOM intact. Cardiovascular: normal rate & rhythm, warm and well-perfused Respiratory: normal effort, no problems with respiration noted GI: Abd soft, non-tender, non-distended MSK: Extremities nontender, no edema, normal ROM Skin: warm and dry. Acyanotic, no jaundice or pallor. Neurologic: Alert and oriented x 4. No abnormal coordination. Psychiatric: Normal mood. Speech not slurred, not rapid/pressured. Patient is cooperative. GU: no CVA tenderness  Labs: Results for orders placed or performed during the hospital encounter of 04/20/24 (from the past 24 hours)  Urinalysis, Routine w reflex microscopic -Urine, Clean Catch     Status: Abnormal   Collection Time: 04/20/24  9:37 AM  Result Value Ref Range   Color, Urine YELLOW YELLOW   APPearance HAZY (A) CLEAR  Specific Gravity, Urine 1.028 1.005 - 1.030   pH 5.0 5.0 - 8.0   Glucose, UA NEGATIVE NEGATIVE mg/dL   Hgb urine dipstick LARGE (A) NEGATIVE   Bilirubin Urine NEGATIVE NEGATIVE   Ketones, ur NEGATIVE NEGATIVE mg/dL   Protein, ur 30 (A) NEGATIVE mg/dL   Nitrite NEGATIVE NEGATIVE   Leukocytes,Ua TRACE (A) NEGATIVE   RBC / HPF >50 0 - 5 RBC/hpf   WBC, UA 6-10 0 - 5 WBC/hpf   Bacteria, UA RARE (A) NONE SEEN   Squamous Epithelial / HPF 0-5 0 - 5 /HPF   Mucus PRESENT   Wet prep, genital     Status: Abnormal   Collection Time: 04/20/24  9:43 AM  Result Value Ref Range   Yeast Wet Prep HPF POC NONE SEEN NONE SEEN   Trich, Wet Prep NONE SEEN NONE SEEN   Clue Cells Wet Prep HPF POC PRESENT (A) NONE SEEN   WBC, Wet Prep HPF POC  <10 <10   Sperm NONE SEEN     Imaging:  US  OB LESS THAN 14 WEEKS WITH OB TRANSVAGINAL Result Date: 04/20/2024 CLINICAL DATA:  Twelve weeks gestation with vaginal bleeding. EXAM: OBSTETRIC <14 WK US  AND TRANSVAGINAL OB US  TECHNIQUE: Both transabdominal and transvaginal ultrasound examinations were performed for complete evaluation of the gestation as well as the maternal uterus, adnexal regions, and pelvic cul-de-sac. Transvaginal technique was performed to assess early pregnancy. COMPARISON:  None Available. FINDINGS: Intrauterine gestational sac: None Yolk sac:  Not Visualized. Embryo:  Not Visualized. Cardiac Activity: Not Visualized. Heart Rate: N/A  bpm Subchorionic hemorrhage:  None visualized. Maternal uterus/adnexae: Right ovary: Corpus luteal cyst identified within the right ovary. Left ovary: Normal Other :Heterogeneous filling defect within the endometrial canal measuring 4.8 x 1.9 by 2.7 cm. Free fluid:  Trace free fluid noted within the pelvis. IMPRESSION: 1. No intrauterine gestational sac, yolk sac, or fetal pole identified. Differential considerations include intrauterine pregnancy too early to be sonographically visualized, missed abortion, or ectopic pregnancy. Followup ultrasound is recommended in 10-14 days for further evaluation. 2. There is a heterogeneous filling defect within the endometrial canal which in the setting of spontaneous abortion, may represent blood clot. If bleeding continues consider short-term follow-up with repeat pelvic sonogram to exclude retained products. Electronically Signed   By: Waddell Calk M.D.   On: 04/20/2024 11:28    MDM & MAU COURSE  MDM: High  MAU Course: -BP elevated initially, otherwise vitals within normal limits.  -Reviewed labs from ED visit yesterday. Too soon to repeat hCG or CBC. -Formal US  to allow for TVUS for better visualization than bedside US . -UA and wet prep to rule out infection. -UA negative for UTI. Wet prep positive for BV,  will treat with metronidazole . -US  does not show IUGS, YS, fetal pole. Heterogeneous filling defect noted in endometrial canal. No prior imaging available for comparison. -Discussed with Dr. Eveline, he personally reviewed US  images, meets criteria for failed pregnancy. Offer expectant versus medication versus surgical management. Patient would like to proceed with medication. Sent to pharmacy with pain medication and nausea medication. She would like to establish care with [facility], message sent to scheduling for 2 week follow up appointment. 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 However, based on the most recent ACOG recommendations, no RhoGam work up is being inititated and, therefore, no RhoGam is being administered in this clinical setting.  Orders Placed This Encounter  Procedures  Wet prep, genital   US  OB LESS THAN 14 WEEKS WITH OB TRANSVAGINAL   Urinalysis, Routine w reflex microscopic -Urine, Clean Catch   Discharge patient   Meds ordered this encounter  Medications   DISCONTD: metroNIDAZOLE  (FLAGYL ) 500 MG tablet    Sig: Take 1 tablet (500 mg total) by mouth 2 (two) times daily for 7 days.    Dispense:  14 tablet    Refill:  0   misoprostol  (CYTOTEC ) 200 MCG tablet    Sig: Place four tablets in between your gums and cheeks (two tablets on each side) as instructed or insert four tablets vaginally    Dispense:  4 tablet    Refill:  1   ibuprofen  (ADVIL ) 600 MG tablet    Sig: Take 1 tablet (600 mg total) by mouth every 6 (six) hours as needed.    Dispense:  60 tablet    Refill:  3   oxyCODONE  (OXY IR/ROXICODONE ) 5 MG immediate release tablet    Sig: Take 1 tablet (5 mg total) by mouth every 4 (four) hours as needed for severe pain (pain score 7-10) or breakthrough pain.    Dispense:  20 tablet    Refill:  0   ondansetron  (ZOFRAN -ODT) 4 MG disintegrating tablet    Sig: Take 1 tablet (4 mg total) by mouth every 8 (eight) hours as needed for  nausea or vomiting.    Dispense:  20 tablet    Refill:  0   metroNIDAZOLE  (FLAGYL ) 500 MG tablet    Sig: Take 1 tablet (500 mg total) by mouth 2 (two) times daily for 7 days.    Dispense:  14 tablet    Refill:  0    ASSESSMENT   1. Vaginal bleeding   2. Bacterial vaginosis   3. Spontaneous abortion     PLAN  Discharge home in stable condition with return precautions.  Metronidazole  for BV.  Reassured patient that miscarriage is common with ~1/4 of women experiencing it in their lifetime. Reassured patient that there is nothing she did or did not do to cause this. Reviewed most common reason is presumed to be genetic abnormalities that allow a pregnancy to start but not continue past an early stage, but realistically we do not know the cause in most cases. Reviewed that studies show no definite difference between attempting another pregnancy sooner vs waiting, though some studies do show better live birth outcomes with trying sooner. Reviewed options of expectant, medical, or surgical management. Patient would like to proceed with medication. Cytotec , ibuprofen , oxycodone , and Zofran  sent to pharmacy. Message sent to [facility] to schedule SAB follow up appointment.  Follow-up Information     [facility] for [facility] Follow up.   Specialty: Obstetrics and Gynecology Contact information: [address] [place]  [address] [phone]                 Allergies as of 04/20/2024   No Known Allergies      Medication List     STOP taking these medications    tiZANidine  4 MG tablet Commonly known as: Zanaflex        TAKE these medications    HYDROcodone -acetaminophen  5-325 MG tablet Commonly known as: NORCO/VICODIN Take 1 tablet by mouth every 6 (six) hours as needed for moderate pain.   ibuprofen  600 MG tablet Commonly known as: ADVIL  Take 1 tablet (600 mg total) by mouth every 6 (six) hours as needed.  metFORMIN  500 MG tablet Commonly known as: GLUCOPHAGE  Take 1 tablet (500 mg total) by mouth 2 (two) times daily with a meal.   metroNIDAZOLE  500 MG tablet Commonly known as: FLAGYL  Take 1 tablet (500 mg total) by mouth 2 (two) times daily for 7 days.   misoprostol  200 MCG tablet Commonly known as: CYTOTEC  Place four tablets in between your gums and cheeks (two tablets on each side) as instructed or insert four tablets vaginally   ondansetron  4 MG disintegrating tablet Commonly known as: Zofran  ODT Take 1 tablet by mouth every 6 hours as needed for nausea. What changed: Another medication with the same name was added. Make sure you understand how and when to take each.   ondansetron  4 MG disintegrating tablet Commonly known as: ZOFRAN -ODT Take 1 tablet (4 mg total) by mouth every 8 (eight) hours as needed for nausea or vomiting. What changed: You were already taking a medication with the same name, and this prescription was added. Make sure you understand how and when to take each.   oxyCODONE  5 MG immediate release tablet Commonly known as: Oxy IR/ROXICODONE  Take 1 tablet (5 mg total) by mouth every 4 (four) hours as needed for severe pain (pain score 7-10) or breakthrough pain.        Joesph DELENA Sear, PA

## 2024-04-20 NOTE — Discharge Instructions (Addendum)
 I am so sorry that this is happening. There is nothing that you did or did not do to cause this, most often genetic abnormalities allow a pregnancy to start but do not continue past an early stage.  Please follow up with your OBGYN to ensure everything has passed and to have your hormones checked.  CYTOTEC  MANAGEMENT Prostaglandins (cytotec ) are the most widely used drug for this purpose. They cause the uterus to cramp and contract. You will place the medicine yourself in the privacy of your home. Empting of the uterus should occur within 3 days but the process may continue for several weeks. The bleeding may seem heavy at times.  I just sent the medication cytotec  to the pharmacy. You will place 800mcg (4 pills) in your vagina or 2 pills on each side of your mouth between your gum and your cheek. This will facilitate passage of the pregnancy.   If you have not started bleeding within 12 hours, please pick up the refill and repeat the dose.  Risk/Side effects: - Cramping and contraction like pain. This is related to the passage of the pregnancy. You can take the ibuprofen  or oxycodone  that I sent to the pharmacy. Ibuprofen  is especially helpful for cramping pain. - Bleeding which is expected and usually not severe. - Mild nausea or GI upset. I have sent in a medication called Zofran  in case you have this side effect - It might not work which would require a procedure.  Benefits: - This medication increases the likelihood you will pass the pregnancy. The failure rate is about 15-20% - Shortens the time to passing your pregnancy. When you wait to pass the pregnancy on your own it can take 2-4 weeks. But usually if this medication is going to work it will help you pass the pregnancy within 24 hours and you can repeat the dose if needed.   After taking this medication we want you to pass clots and blood. This is normal. We usually repeat your ultrasound in about 2-3 weeks to make sure everything has  passed. Please contact your OBGYN to schedule a follow up ultrasound and an appointment to talk about your estrogen levels.  You would seek care at the hospital for - Dizziness, lightheadedness - Severe abdominal pain - Fill 1 pad per hour of blood - Fever or chills - any other concern

## 2024-04-20 NOTE — MAU Note (Signed)
.  Melissa Lin is a 33 y.o. at Unknown here in MAU reporting: Patient reports vaginal spotting for 2 weeks. Patient reports she was told to come in last night for an u/s, but had to check on her other kids.  2/10 abdominal cramping LMP: 01/27/2024 Onset of complaint: 2 weeks Pain score: 2/10 There were no vitals filed for this visit.   none Lab orders placed from triage:   ua

## 2024-04-21 ENCOUNTER — Telehealth: Payer: Self-pay

## 2024-04-21 LAB — GC/CHLAMYDIA PROBE AMP (~~LOC~~) NOT AT ARMC
Chlamydia: NEGATIVE
Comment: NEGATIVE
Comment: NORMAL
Neisseria Gonorrhea: NEGATIVE

## 2024-04-21 NOTE — Telephone Encounter (Signed)
 Patient called and stated that she had a miscarriage over the weekend and was told to get established with an OB to get her levels checked.   Can you please look at her chart and see what needs to be done; I told her a nurse would call her back.

## 2024-04-22 NOTE — Telephone Encounter (Signed)
 LMOVM returning patient's call to get scheduled for repeat labs and follow-up visit in 2 weeks.

## 2024-05-01 ENCOUNTER — Other Ambulatory Visit

## 2024-05-01 DIAGNOSIS — O039 Complete or unspecified spontaneous abortion without complication: Secondary | ICD-10-CM

## 2024-05-02 ENCOUNTER — Ambulatory Visit: Payer: Self-pay | Admitting: Obstetrics & Gynecology

## 2024-05-02 LAB — CBC
Hematocrit: 31.7 % — ABNORMAL LOW (ref 34.0–46.6)
Hemoglobin: 10.3 g/dL — ABNORMAL LOW (ref 11.1–15.9)
MCH: 28.9 pg (ref 26.6–33.0)
MCHC: 32.5 g/dL (ref 31.5–35.7)
MCV: 89 fL (ref 79–97)
Platelets: 350 x10E3/uL (ref 150–450)
RBC: 3.56 x10E6/uL — ABNORMAL LOW (ref 3.77–5.28)
RDW: 13.9 % (ref 11.7–15.4)
WBC: 9.2 x10E3/uL (ref 3.4–10.8)

## 2024-05-02 LAB — BETA HCG QUANT (REF LAB): hCG Quant: 95 m[IU]/mL

## 2024-05-08 ENCOUNTER — Ambulatory Visit: Admitting: Advanced Practice Midwife

## 2024-05-08 ENCOUNTER — Other Ambulatory Visit: Payer: Self-pay | Admitting: Obstetrics & Gynecology

## 2024-05-08 ENCOUNTER — Encounter: Payer: Self-pay | Admitting: Advanced Practice Midwife

## 2024-05-08 VITALS — BP 113/79 | HR 99 | Ht 64.0 in | Wt 264.0 lb

## 2024-05-08 DIAGNOSIS — Z3A00 Weeks of gestation of pregnancy not specified: Secondary | ICD-10-CM | POA: Diagnosis not present

## 2024-05-08 DIAGNOSIS — O039 Complete or unspecified spontaneous abortion without complication: Secondary | ICD-10-CM | POA: Diagnosis not present

## 2024-05-08 MED ORDER — PRENATAL VITAMIN PLUS LOW IRON 27-1 MG PO TABS: 1.0000 | ORAL_TABLET | Freq: Every day | ORAL | 4 refills | Status: AC

## 2024-05-08 NOTE — Progress Notes (Signed)
 [facility] Visit  Patient name: Melissa Lin MRN [id number]  Date of birth: Mar 16, 1991  CC & HPI:  Melissa Lin is a 33 y.o. African American female presenting today for f/u SAB.  Had MAB on 7/6, took cytotec  and bled for a few days, hasn't had any bleeding in several days.  Appropriate HCG drop on 7/17.  Wants to try again.  2nd SAB, has had 2 normal pregnancies, partner has 3 children.   Pertinent History Reviewed:  Medical & Surgical Hx:   Past Medical History:  Diagnosis Date   Anxiety    Depression    Migraines    Missed periods 06/24/2015   Weight gain 06/24/2015   Past Surgical History:  Procedure Laterality Date   DILATION AND CURETTAGE OF UTERUS     WISDOM TOOTH EXTRACTION     Family History  Problem Relation Age of Onset   Lupus Mother    Diabetes Mother    Hypertension Father    Other Sister        high testosterone  level   Diabetes Sister        borderline   Eczema Son    Diabetes Maternal Grandmother    Diabetes Paternal Grandmother    Hypertension Paternal Grandfather    Other Paternal Grandfather        had open heart surgery    Current Outpatient Medications:    ibuprofen  (ADVIL ) 600 MG tablet, Take 1 tablet (600 mg total) by mouth every 6 (six) hours as needed., Disp: 60 tablet, Rfl: 3   Prenatal Vit-Fe Fumarate-FA (PRENATAL VITAMIN PLUS LOW IRON ) 27-1 MG TABS, Take 1 tablet by mouth daily., Disp: 90 tablet, Rfl: 4   HYDROcodone -acetaminophen  (NORCO/VICODIN) 5-325 MG tablet, Take 1 tablet by mouth every 6 (six) hours as needed for moderate pain., Disp: 18 tablet, Rfl: 0   metFORMIN  (GLUCOPHAGE ) 500 MG tablet, Take 1 tablet (500 mg total) by mouth 2 (two) times daily with a meal. (Patient not taking: Reported on 05/08/2024), Disp: 60 tablet, Rfl: 2   misoprostol  (CYTOTEC ) 200 MCG tablet, Place four tablets in between your gums and cheeks (two tablets on each side) as instructed or insert four tablets vaginally, Disp: 4 tablet, Rfl: 1    ondansetron  (ZOFRAN  ODT) 4 MG disintegrating tablet, Take 1 tablet by mouth every 6 hours as needed for nausea., Disp: 16 tablet, Rfl: 0   ondansetron  (ZOFRAN -ODT) 4 MG disintegrating tablet, Take 1 tablet (4 mg total) by mouth every 8 (eight) hours as needed for nausea or vomiting. (Patient not taking: Reported on 05/08/2024), Disp: 20 tablet, Rfl: 0   oxyCODONE  (OXY IR/ROXICODONE ) 5 MG immediate release tablet, Take 1 tablet (5 mg total) by mouth every 4 (four) hours as needed for severe pain (pain score 7-10) or breakthrough pain., Disp: 20 tablet, Rfl: 0 Social History: Reviewed -  reports that she has never smoked. She has never used smokeless tobacco.  Review of Systems:   Constitutional: Negative for fever and chills Eyes: Negative for visual disturbances Respiratory: Negative for shortness of breath, dyspnea Cardiovascular: Negative for chest pain or palpitations  Gastrointestinal: Negative for vomiting, diarrhea and constipation; no abdominal pain Genitourinary: Negative for dysuria and urgency, vaginal irritation or itching Musculoskeletal: Negative for back pain, joint pain, myalgias  Neurological: Negative for dizziness and headaches    Objective Findings:    Physical Examination: Vitals:   05/08/24 1625  BP: 113/79  Pulse: 99   General appearance - well appearing, and  in no distress Mental status - alert, oriented to person, place, and time Chest:  Normal respiratory effort Heart - normal rate and regular rhythm Abdomen:  Soft, nontender Pelvic: deferred Musculoskeletal:  Normal range of motion without pain Extremities:  No edema    No results found for this or any previous visit (from the past 24 hours).    Assessment & Plan:  A:   SAB P:  Rx PNV w/Fe, try again after one complete cycle.    No follow-ups on file.  Cathlean Ely CNM 05/08/2024 5:07 PM

## 2024-05-08 NOTE — Progress Notes (Signed)
 Rx for PNV  Minor Iden, DO Attending Obstetrician & Gynecologist, [facility] for [employer], [facility]

## 2024-06-29 ENCOUNTER — Ambulatory Visit
Admission: EM | Admit: 2024-06-29 | Discharge: 2024-06-29 | Disposition: A | Discharge status: Home / Self Care | Attending: Nurse Practitioner | Admitting: Nurse Practitioner

## 2024-06-29 ENCOUNTER — Encounter: Payer: Self-pay | Admitting: Emergency Medicine

## 2024-06-29 DIAGNOSIS — J208 Acute bronchitis due to other specified organisms: Secondary | ICD-10-CM | POA: Diagnosis not present

## 2024-06-29 LAB — POC SOFIA SARS ANTIGEN FIA: SARS Coronavirus 2 Ag: NEGATIVE

## 2024-06-29 MED ORDER — PROMETHAZINE-DM 6.25-15 MG/5ML PO SYRP: 5.0000 mL | ORAL_SOLUTION | Freq: Four times a day (QID) | ORAL | 0 refills | Status: DC | PRN

## 2024-06-29 MED ORDER — ALBUTEROL SULFATE HFA 108 (90 BASE) MCG/ACT IN AERS: 2.0000 | INHALATION_SPRAY | Freq: Four times a day (QID) | RESPIRATORY_TRACT | 0 refills | Status: AC | PRN

## 2024-06-29 MED ORDER — PREDNISONE 20 MG PO TABS: 40.0000 mg | ORAL_TABLET | Freq: Every day | ORAL | 0 refills | Status: AC

## 2024-06-29 NOTE — ED Provider Notes (Signed)
 [facility]    CSN: [id number] Arrival date & time: 06/29/24  0840      History   Chief Complaint Chief Complaint  Patient presents with   cough, SOB, headache    HPI Melissa Lin is a 33 y.o. female.   The history is provided by the patient.   Patient presents with a 1 week history of cough, chills, and wheezing.  Patient denies fever, headache, ear pain, difficulty breathing, chest pain, abdominal pain, nausea, vomiting, diarrhea, or rash.  Patient states she has been taking TheraFlu for her symptoms.  She denies any obvious known sick contacts.  States that she did have have asthma when she was younger.  Past Medical History:  Diagnosis Date   Anxiety    Depression    Migraines    Missed periods 06/24/2015   Weight gain 06/24/2015    Patient Active Problem List   Diagnosis Date Noted   PCOS (polycystic ovarian syndrome) 01/31/2016   Morbid obesity (HCC) 01/01/2016   Hirsutism 01/01/2016   Acanthosis nigricans 01/01/2016   Migraine headache without aura 02/18/2015   LOW BACK PAIN 12/24/2007    Past Surgical History:  Procedure Laterality Date   DILATION AND CURETTAGE OF UTERUS     WISDOM TOOTH EXTRACTION      OB History     Gravida  4   Para  2   Term  2   Preterm      AB  2   Living         SAB  2   IAB      Ectopic      Multiple      Live Births               Home Medications    Prior to Admission medications   Medication Sig Start Date End Date Taking? Authorizing Provider  albuterol  (VENTOLIN  HFA) 108 (90 Base) MCG/ACT inhaler Inhale 2 puffs into the lungs every 6 (six) hours as needed. 06/29/24  Yes Leath-Warren, Etta PARAS, NP  predniSONE  (DELTASONE ) 20 MG tablet Take 2 tablets (40 mg total) by mouth daily with breakfast for 5 days. 06/29/24 07/04/24 Yes Leath-Warren, Etta PARAS, NP  promethazine -dextromethorphan (PROMETHAZINE -DM) 6.25-15 MG/5ML syrup Take 5 mLs by mouth 4 (four) times daily as needed. 06/29/24   Yes Leath-Warren, Etta PARAS, NP  Prenatal Vit-Fe Fumarate-FA (PRENATAL VITAMIN PLUS LOW IRON ) 27-1 MG TABS Take 1 tablet by mouth daily. 05/08/24   Ozan, Jennifer, DO    Family History Family History  Problem Relation Age of Onset   Lupus Mother    Diabetes Mother    Hypertension Father    Other Sister        high testosterone  level   Diabetes Sister        borderline   Eczema Son    Diabetes Maternal Grandmother    Diabetes Paternal Grandmother    Hypertension Paternal Grandfather    Other Paternal Grandfather        had open heart surgery    Social History Social History   Tobacco Use   Smoking status: Never   Smokeless tobacco: Never  Vaping Use   Vaping status: Never Used  Substance Use Topics   Alcohol use: Not Currently    Comment: on birthday's   Drug use: No     Allergies   Patient has no known allergies.   Review of Systems Review of Systems Per HPI  Physical Exam Triage Vital  Signs ED Triage Vitals  Encounter Vitals Group     BP 06/29/24 0850 (!) 161/95     Girls Systolic BP Percentile --      Girls Diastolic BP Percentile --      Boys Systolic BP Percentile --      Boys Diastolic BP Percentile --      Pulse Rate 06/29/24 0850 (!) 118     Resp 06/29/24 0850 18     Temp 06/29/24 0850 98.4 F (36.9 C)     Temp Source 06/29/24 0850 Oral     SpO2 06/29/24 0850 97 %     Weight --      Height --      Head Circumference --      Peak Flow --      Pain Score 06/29/24 0851 5     Pain Loc --      Pain Education --      Exclude from Growth Chart --    No data found.  Updated Vital Signs BP (!) 161/95 (BP Location: Right Arm)   Pulse (!) 118   Temp 98.4 F (36.9 C) (Oral)   Resp 18   LMP 06/04/2024 (Exact Date)   SpO2 97%   Breastfeeding No   Visual Acuity Right Eye Distance:   Left Eye Distance:   Bilateral Distance:    Right Eye Near:   Left Eye Near:    Bilateral Near:     Physical Exam Vitals and nursing note reviewed.   Constitutional:      General: She is not in acute distress.    Appearance: Normal appearance.  HENT:     Head: Normocephalic.     Right Ear: Tympanic membrane, ear canal and external ear normal.     Left Ear: Tympanic membrane, ear canal and external ear normal.     Nose: Nose normal.     Mouth/Throat:     Mouth: Mucous membranes are moist.     Pharynx: No posterior oropharyngeal erythema.  Eyes:     Extraocular Movements: Extraocular movements intact.     Conjunctiva/sclera: Conjunctivae normal.     Pupils: Pupils are equal, round, and reactive to light.  Cardiovascular:     Rate and Rhythm: Regular rhythm. Tachycardia present.     Pulses: Normal pulses.     Heart sounds: Normal heart sounds.  Pulmonary:     Effort: Pulmonary effort is normal. No respiratory distress.     Breath sounds: Normal breath sounds. No stridor. No wheezing, rhonchi or rales.  Abdominal:     General: Bowel sounds are normal.     Palpations: Abdomen is soft.     Tenderness: There is no abdominal tenderness.  Musculoskeletal:     Cervical back: Normal range of motion.  Skin:    General: Skin is warm and dry.  Neurological:     General: No focal deficit present.     Mental Status: She is alert and oriented to person, place, and time.  Psychiatric:        Mood and Affect: Mood normal.        Behavior: Behavior normal.      UC Treatments / Results  Labs (all labs ordered are listed, but only abnormal results are displayed) Labs Reviewed  POC SOFIA SARS ANTIGEN FIA    EKG   Radiology No results found.  Procedures Procedures (including critical care time)  Medications Ordered in UC Medications - No data to display  Initial Impression /  Assessment and Plan / UC Course  I have reviewed the triage vital signs and the nursing notes.  Pertinent labs & imaging results that were available during my care of the patient were reviewed by me and considered in my medical decision making (see  chart for details).  On exam, lung sounds are clear throughout, room air sats at 97%.  The patient is hypertensive and tachycardic during initial triage, however, she is in no acute distress.  Lung sounds are clear throughout.  Symptoms are consistent with viral bronchitis.  Will provide symptomatic treatment with prednisone  40 mg, Promethazine  DM for the cough, and an albuterol  inhaler for shortness of breath or wheezing.  Supportive care recommendations were provided and discussed with the patient to include fluids, rest, over-the-counter analgesics, and use of a humidifier during sleep.  Discussed indications with patient regarding follow-up.  Patient was in agreement with this plan of care and verbalizes understanding.  All questions were answered.  Patient stable for discharge.  Work note was provided.  Final Clinical Impressions(s) / UC Diagnoses   Final diagnoses:  Viral bronchitis     Discharge Instructions      Take medication as prescribed. Increase fluids and allow for plenty of rest. You may take over-the-counter Tylenol  as needed for pain, fever, or general discomfort.  You may also take over-the-counter cough medication such as Delsym or Robitussin during the daytime for your cough. Recommend use of a humidifier in your bedroom at nighttime during sleep and sleeping elevated on pillows while symptoms persist. As discussed, your cough may last from days to weeks.  As long as you are generally feeling well but may have a nagging persistent cough, continue use of over-the-counter cough medications.  Seek care if you develop fever, worsening wheezing, difficulty breathing, or other concerns. Follow-up as needed.     ED Prescriptions     Medication Sig Dispense Auth. Provider   predniSONE  (DELTASONE ) 20 MG tablet Take 2 tablets (40 mg total) by mouth daily with breakfast for 5 days. 10 tablet Leath-Warren, Etta PARAS, NP   albuterol  (VENTOLIN  HFA) 108 (90 Base) MCG/ACT inhaler  Inhale 2 puffs into the lungs every 6 (six) hours as needed. 8 g Leath-Warren, Etta PARAS, NP   promethazine -dextromethorphan (PROMETHAZINE -DM) 6.25-15 MG/5ML syrup Take 5 mLs by mouth 4 (four) times daily as needed. 118 mL Leath-Warren, Etta PARAS, NP      PDMP not reviewed this encounter.   Gilmer Etta PARAS, NP 06/29/24 [id number]

## 2024-06-29 NOTE — Discharge Instructions (Signed)
 Take medication as prescribed. Increase fluids and allow for plenty of rest. You may take over-the-counter Tylenol  as needed for pain, fever, or general discomfort.  You may also take over-the-counter cough medication such as Delsym or Robitussin during the daytime for your cough. Recommend use of a humidifier in your bedroom at nighttime during sleep and sleeping elevated on pillows while symptoms persist. As discussed, your cough may last from days to weeks.  As long as you are generally feeling well but may have a nagging persistent cough, continue use of over-the-counter cough medications.  Seek care if you develop fever, worsening wheezing, difficulty breathing, or other concerns. Follow-up as needed.

## 2024-06-29 NOTE — ED Triage Notes (Signed)
 Cough, runny nose, headache and SOB since Sunday.  Has been taking thera flu.  States chest feels heavy

## 2024-11-13 ENCOUNTER — Encounter: Payer: Self-pay | Admitting: Advanced Practice Midwife

## 2024-11-13 ENCOUNTER — Ambulatory Visit (INDEPENDENT_AMBULATORY_CARE_PROVIDER_SITE_OTHER): Admitting: Advanced Practice Midwife

## 2024-11-13 ENCOUNTER — Other Ambulatory Visit (HOSPITAL_COMMUNITY)
Admission: RE | Admit: 2024-11-13 | Discharge: 2024-11-13 | Disposition: A | Source: Ambulatory Visit | Attending: Obstetrics & Gynecology | Admitting: Obstetrics & Gynecology

## 2024-11-13 VITALS — BP 118/82 | HR 94 | Ht 62.0 in | Wt 277.0 lb

## 2024-11-13 DIAGNOSIS — Z1329 Encounter for screening for other suspected endocrine disorder: Secondary | ICD-10-CM | POA: Diagnosis not present

## 2024-11-13 DIAGNOSIS — Z01419 Encounter for gynecological examination (general) (routine) without abnormal findings: Secondary | ICD-10-CM

## 2024-11-13 DIAGNOSIS — Z13220 Encounter for screening for lipoid disorders: Secondary | ICD-10-CM

## 2024-11-13 DIAGNOSIS — Z1331 Encounter for screening for depression: Secondary | ICD-10-CM | POA: Diagnosis not present

## 2024-11-13 DIAGNOSIS — Z131 Encounter for screening for diabetes mellitus: Secondary | ICD-10-CM

## 2024-11-13 DIAGNOSIS — Z7689 Persons encountering health services in other specified circumstances: Secondary | ICD-10-CM

## 2024-11-13 DIAGNOSIS — E669 Obesity, unspecified: Secondary | ICD-10-CM

## 2024-11-13 MED ORDER — TRANEXAMIC ACID 650 MG PO TABS: 1300.0000 mg | ORAL_TABLET | Freq: Three times a day (TID) | ORAL | 6 refills | Status: AC

## 2024-11-13 NOTE — Progress Notes (Signed)
 Melissa Lin 34 y.o.  Vitals:   11/13/24 0830  BP: 118/82  Pulse: 94     Filed Weights   11/13/24 0830  Weight: 277 lb (125.6 kg)    Past Medical History: Past Medical History:  Diagnosis Date   Anxiety    Depression    Migraines    Missed periods 06/24/2015   Weight gain 06/24/2015    Past Surgical History: Past Surgical History:  Procedure Laterality Date   DILATION AND CURETTAGE OF UTERUS     WISDOM TOOTH EXTRACTION      Family History: Family History  Problem Relation Age of Onset   Lupus Mother    Diabetes Mother    Hypertension Father    Other Sister        high testosterone  level   Diabetes Sister        borderline   Eczema Son    Diabetes Maternal Grandmother    Diabetes Paternal Grandmother    Hypertension Paternal Grandfather    Other Paternal Grandfather        had open heart surgery    Social History: Social History[1]  Allergies: Allergies[2]   Current Medications[3]      11/13/2024    8:42 AM  Depression screen PHQ 2/9  Decreased Interest 1  Down, Depressed, Hopeless 0  PHQ - 2 Score 1  Altered sleeping 1  Tired, decreased energy 2  Change in appetite 0  Feeling bad or failure about yourself  1  Trouble concentrating 0  Moving slowly or fidgety/restless 0  Suicidal thoughts 0  PHQ-9 Score 5        11/13/2024    8:42 AM  GAD 7 : Generalized Anxiety Score  Nervous, Anxious, on Edge 0  Control/stop worrying 0  Worry too much - different things 0  Trouble relaxing 0  Restless 0  Easily annoyed or irritable 0  Afraid - awful might happen 0  Total GAD 7 Score 0      Mental Health score  Negative Follow up:  N/A   Upstream - 11/13/24 0848       Pregnancy Intention Screening   Does the patient want to become pregnant in the next year? Yes    Does the patient's partner want to become pregnant in the next year? Yes    Would the patient like to discuss contraceptive options today? No      Contraception Wrap Up    Current Method Pregnant/Seeking Pregnancy    End Method Pregnant/Seeking Pregnancy    Contraception Counseling Provided No          The pregnancy intention screening data noted above was reviewed.   History of Present Illness: Here for pap and physical.   Wants to get pregnant. Had 2nd 1st trimester SAB in July.  Menstrual bleeding is heavy and regular.  Using OV predictor kits, has gotten 5/5 + OV, had IC timed correctly 3 of those 5 times. .  Discussed that statistically,  there is only a 15% chance of conceiving with properly time IC each time. Offered TXA for heavy periods, would like to try.  Aware that it is non hormonal, would not affect ability to conceive.   Prior cytology:  2023, normal    Review of Systems   Patient denies any headaches, blurred vision, shortness of breath, chest pain, abdominal pain, problems with bowel movements, urination, or intercourse.   Physical Exam: General:  Well developed, well nourished, no acute distress Skin:  Warm and dry Neck:  Midline trachea Lungs; Clear to auscultation bilaterally Breast:  No dominant palpable mass, retraction, or nipple discharge Cardiovascular: Regular rate and rhythm Abdomen:  Soft, non tender, no hepatosplenomegaly Pelvic:  External genitalia is normal in appearance.  The vagina is normal in appearance.  The cervix is bulbous.  Uterus is felt to be normal size, shape, and contour.  No adnexal masses or tenderness noted. Exam limited by habitus Extremities:  No swelling or varicosities noted  Chaperone: Peggy Dones  Impression: Normal GYN exam   Obesity:  wants to lose weight. Nutritional consult ordered  Desiring pregnancy:  continue OV predictors, time IC properly (with + LH surge and for a few days after)--restart PNV   If pap normal, repeat in 3 years   Orders Placed This Encounter  Procedures   CBC   Comprehensive metabolic panel with GFR    Has the patient fasted?:   Yes   Hemoglobin A1c   Lipid  panel    Has the patient fasted?:   Yes   TSH   Consult to Registered Dietitian    Pt wants to lose weight    Scheduling Instructions:     Please call for an appointment               [1]  Social History Tobacco Use   Smoking status: Never   Smokeless tobacco: Never  Vaping Use   Vaping status: Never Used  Substance Use Topics   Alcohol use: Not Currently    Comment: on birthday's   Drug use: No  [2] No Known Allergies [3]  Current Outpatient Medications:    albuterol  (VENTOLIN  HFA) 108 (90 Base) MCG/ACT inhaler, Inhale 2 puffs into the lungs every 6 (six) hours as needed., Disp: 8 g, Rfl: 0   Prenatal Vit-Fe Fumarate-FA (PRENATAL VITAMIN PLUS LOW IRON ) 27-1 MG TABS, Take 1 tablet by mouth daily. (Patient not taking: Reported on 11/13/2024), Disp: 90 tablet, Rfl: 4   promethazine -dextromethorphan (PROMETHAZINE -DM) 6.25-15 MG/5ML syrup, Take 5 mLs by mouth 4 (four) times daily as needed., Disp: 118 mL, Rfl: 0

## 2024-11-13 NOTE — Patient Instructions (Addendum)
 [facility]                   [facility]): [address], [phone] (call to ask if accepting patients) [facility]: [address], [place], [phone]   [facility]  [address] [place]  [phone] [facility]) [address]  [place]  [phone] [facility]:  [phone]     [facility]  [facility]: [address] [place], [phone]  [phone] [facility]   [address]  [place], [place] [address]   [facility]: [address], [phone] [facility]  [address] [place], [place] [address]  [phone]  [facility]  [facility]): [phone] [facility]: [address] [phone]   [facility]: [address], [phone]  [facility]  [facility]: [phone], [phone]

## 2024-11-14 LAB — COMPREHENSIVE METABOLIC PANEL WITH GFR
ALT: 21 [IU]/L (ref 0–32)
AST: 17 [IU]/L (ref 0–40)
Albumin: 4.2 g/dL (ref 3.9–4.9)
Alkaline Phosphatase: 95 [IU]/L (ref 41–116)
BUN/Creatinine Ratio: 13 (ref 9–23)
BUN: 9 mg/dL (ref 6–20)
Bilirubin Total: 0.3 mg/dL (ref 0.0–1.2)
CO2: 19 mmol/L — ABNORMAL LOW (ref 20–29)
Calcium: 9.5 mg/dL (ref 8.7–10.2)
Chloride: 102 mmol/L (ref 96–106)
Creatinine, Ser: 0.71 mg/dL (ref 0.57–1.00)
Globulin, Total: 2.6 g/dL (ref 1.5–4.5)
Glucose: 93 mg/dL (ref 70–99)
Potassium: 4.6 mmol/L (ref 3.5–5.2)
Sodium: 140 mmol/L (ref 134–144)
Total Protein: 6.8 g/dL (ref 6.0–8.5)
eGFR: 114 mL/min/{1.73_m2}

## 2024-11-14 LAB — CBC
Hematocrit: 36.5 % (ref 34.0–46.6)
Hemoglobin: 12.2 g/dL (ref 11.1–15.9)
MCH: 28.8 pg (ref 26.6–33.0)
MCHC: 33.4 g/dL (ref 31.5–35.7)
MCV: 86 fL (ref 79–97)
Platelets: 327 10*3/uL (ref 150–450)
RBC: 4.23 x10E6/uL (ref 3.77–5.28)
RDW: 13.1 % (ref 11.7–15.4)
WBC: 9.6 10*3/uL (ref 3.4–10.8)

## 2024-11-14 LAB — LIPID PANEL
Chol/HDL Ratio: 5.5 ratio — ABNORMAL HIGH (ref 0.0–4.4)
Cholesterol, Total: 192 mg/dL (ref 100–199)
HDL: 35 mg/dL — ABNORMAL LOW
LDL Chol Calc (NIH): 131 mg/dL — ABNORMAL HIGH (ref 0–99)
Triglycerides: 144 mg/dL (ref 0–149)
VLDL Cholesterol Cal: 26 mg/dL (ref 5–40)

## 2024-11-14 LAB — TSH: TSH: 1.48 u[IU]/mL (ref 0.450–4.500)

## 2024-11-14 LAB — HEMOGLOBIN A1C
Est. average glucose Bld gHb Est-mCnc: 131 mg/dL
Hgb A1c MFr Bld: 6.2 % — ABNORMAL HIGH (ref 4.8–5.6)

## 2024-11-17 ENCOUNTER — Ambulatory Visit: Payer: Self-pay | Admitting: Advanced Practice Midwife

## 2024-11-17 LAB — CYTOLOGY - PAP
Adequacy: ABSENT
Chlamydia: NEGATIVE
Comment: NEGATIVE
Comment: NEGATIVE
Comment: NEGATIVE
Comment: NORMAL
Diagnosis: NEGATIVE
High risk HPV: NEGATIVE
Neisseria Gonorrhea: NEGATIVE
Trichomonas: NEGATIVE
